# Patient Record
Sex: Female | Born: 1985 | Race: White | Hispanic: No | Marital: Single | Smoking: Never smoker
Health system: Southern US, Community
[De-identification: ages and names within clinical notes are randomized; demographics above are authoritative.]

## PROBLEM LIST (undated history)

## (undated) DIAGNOSIS — G43909 Migraine, unspecified, not intractable, without status migrainosus: Secondary | ICD-10-CM

## (undated) HISTORY — PX: NO PAST SURGERIES: SHX2092

## (undated) HISTORY — DX: Migraine, unspecified, not intractable, without status migrainosus: G43.909

---
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
## 2008-12-29 ENCOUNTER — Emergency Department (HOSPITAL_COMMUNITY): Admission: EM | Admit: 2008-12-29 | Discharge: 2008-12-29 | Payer: Self-pay | Admitting: Emergency Medicine

## 2009-11-30 IMAGING — CR DG ANKLE COMPLETE 3+V*L*
3 series · 3 of 3 positions shown · non-contrast
Comparison: None

CLINICAL DATA: The patient fell.  Lateral ankle pain.

LEFT ANKLE COMPLETE - 3+ VIEW

[t ankle joint ap left]
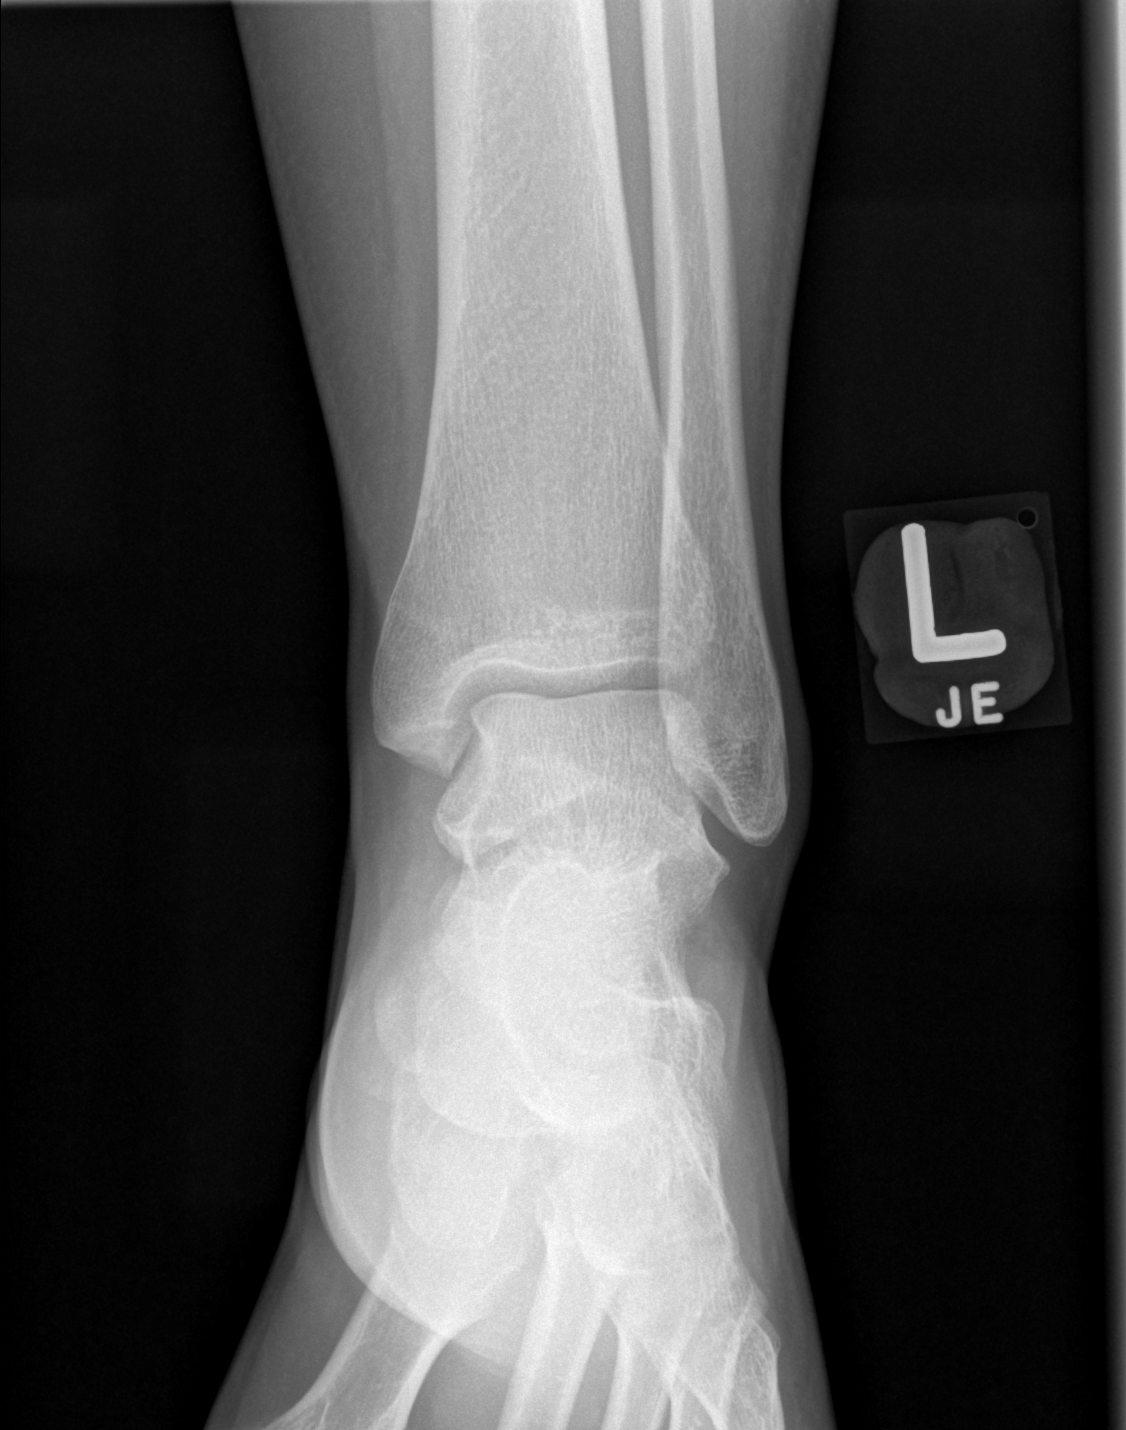

[t ankle joint oblique left]
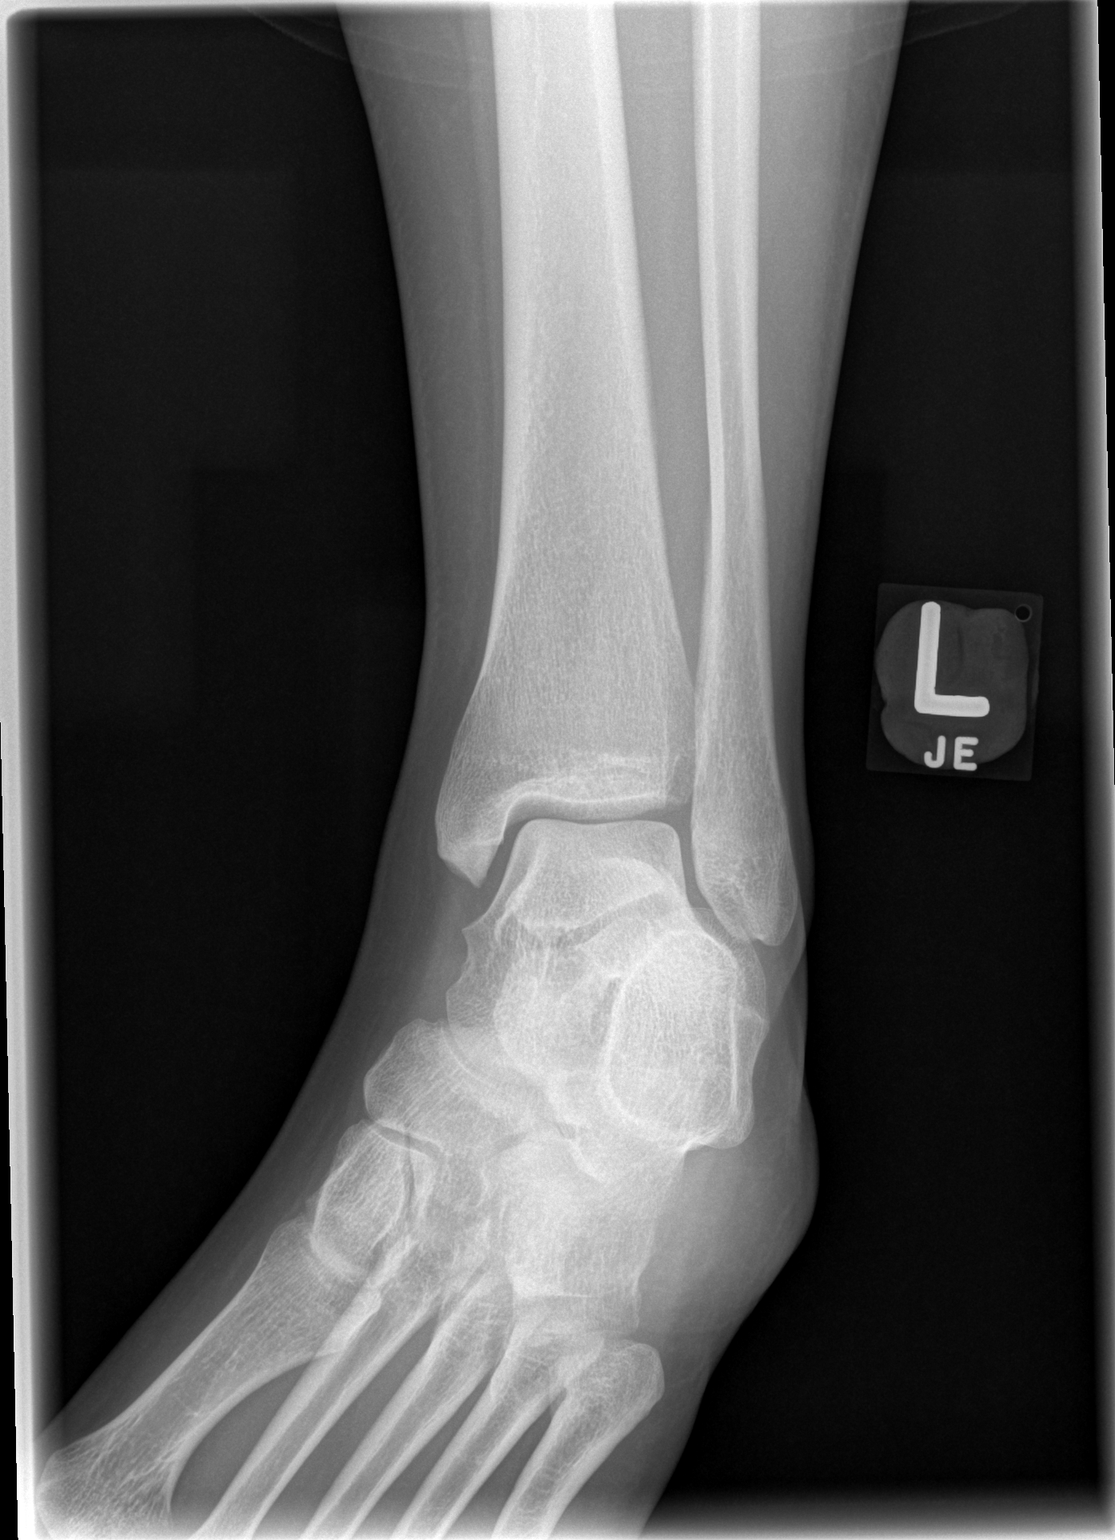

[t ankle joint lat left]
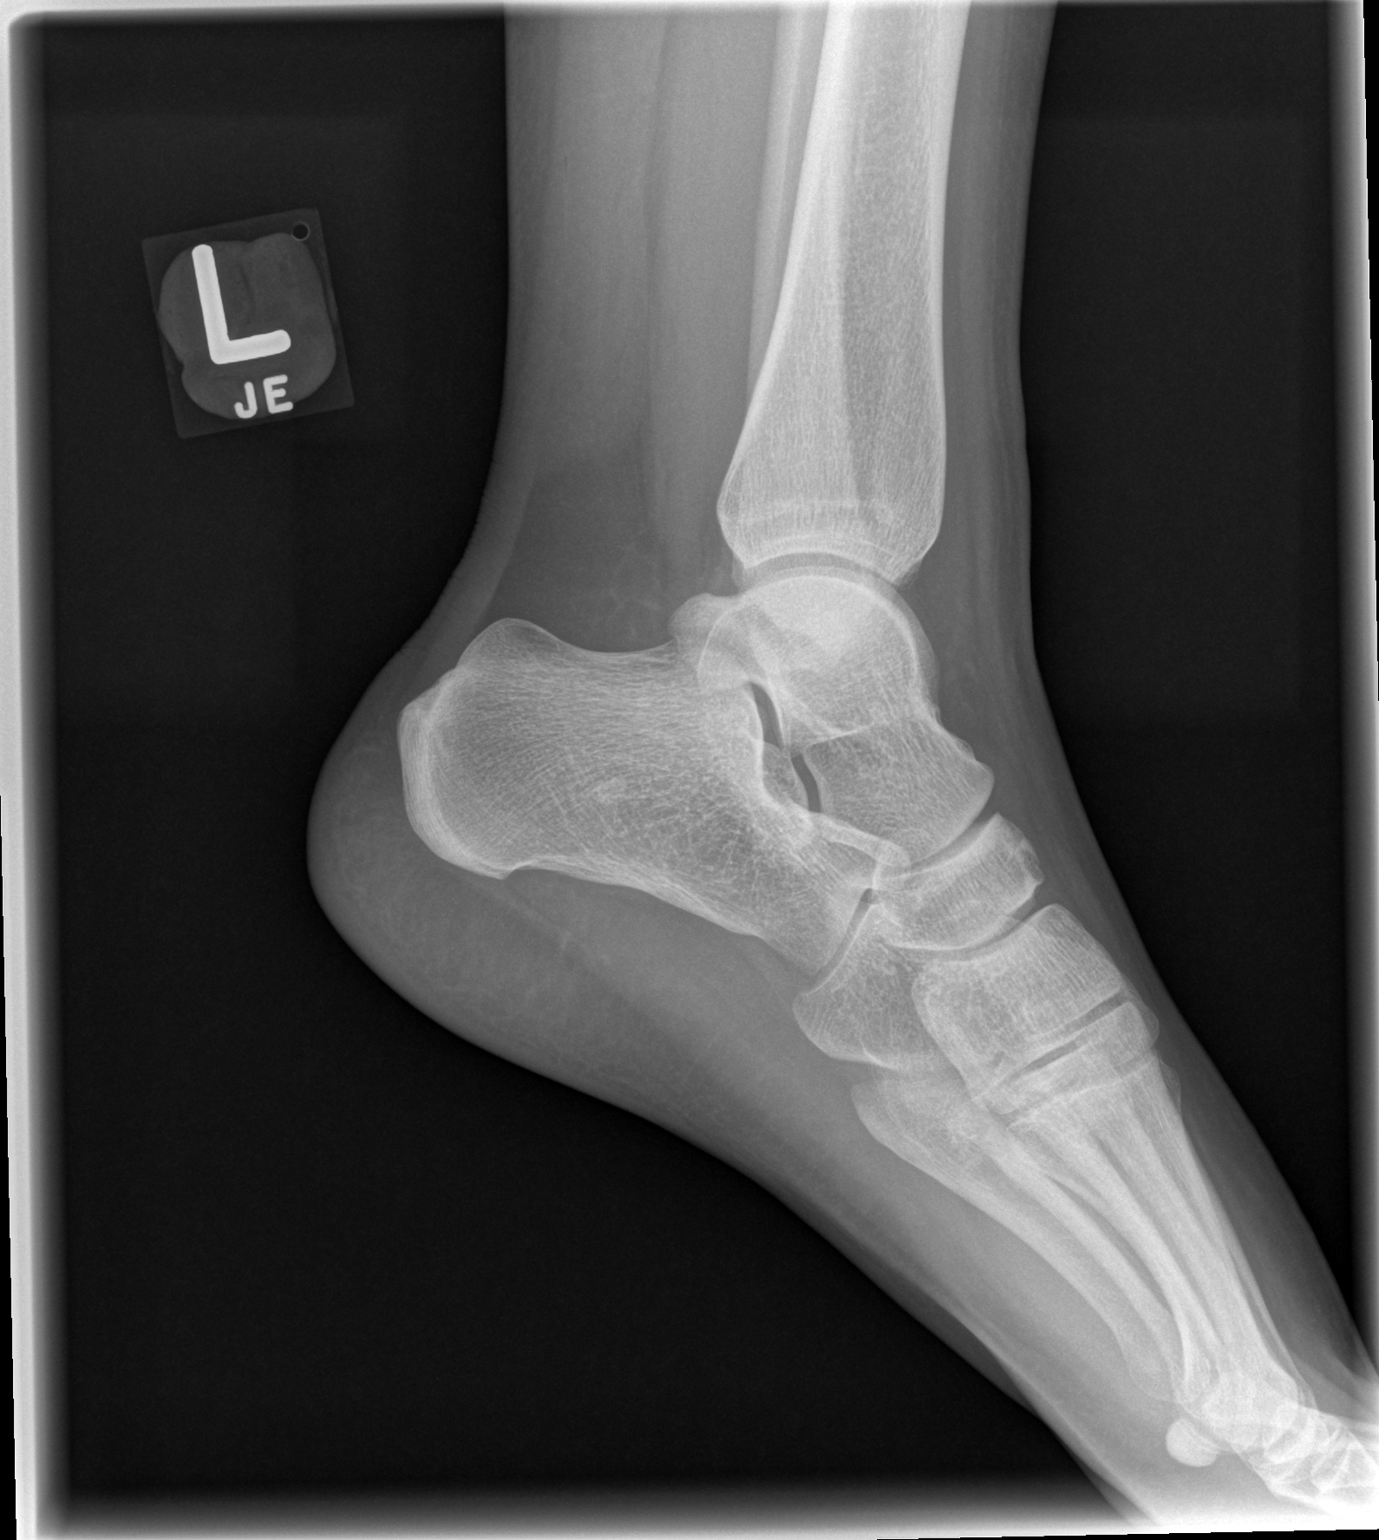

[3 of 3 positions shown; findings below may reference images not displayed]

FINDINGS: No fracture or subluxation.
IMPRESSION: No acute bony abnormality.

## 2013-09-12 ENCOUNTER — Emergency Department (HOSPITAL_COMMUNITY)
Admission: EM | Admit: 2013-09-12 | Discharge: 2013-09-12 | Disposition: A | Discharge status: Home / Self Care | Payer: Self-pay | Attending: Emergency Medicine | Admitting: Emergency Medicine

## 2013-09-12 ENCOUNTER — Encounter (HOSPITAL_COMMUNITY): Payer: Self-pay | Admitting: Emergency Medicine

## 2013-09-12 DIAGNOSIS — Y939 Activity, unspecified: Secondary | ICD-10-CM | POA: Insufficient documentation

## 2013-09-12 DIAGNOSIS — W1809XA Striking against other object with subsequent fall, initial encounter: Secondary | ICD-10-CM | POA: Insufficient documentation

## 2013-09-12 DIAGNOSIS — Y92009 Unspecified place in unspecified non-institutional (private) residence as the place of occurrence of the external cause: Secondary | ICD-10-CM | POA: Insufficient documentation

## 2013-09-12 DIAGNOSIS — S0990XA Unspecified injury of head, initial encounter: Secondary | ICD-10-CM | POA: Insufficient documentation

## 2013-09-12 DIAGNOSIS — Z3202 Encounter for pregnancy test, result negative: Secondary | ICD-10-CM | POA: Insufficient documentation

## 2013-09-12 LAB — POCT PREGNANCY, URINE: Preg Test, Ur: NEGATIVE

## 2013-09-12 MED ORDER — DEXAMETHASONE SODIUM PHOSPHATE 10 MG/ML IJ SOLN
10.0000 mg | Freq: Once | INTRAMUSCULAR | Status: AC
  Administered 2013-09-12: 10 mg via INTRAVENOUS
  Filled 2013-09-12: qty 1

## 2013-09-12 MED ORDER — KETOROLAC TROMETHAMINE 30 MG/ML IJ SOLN: 30.0000 mg | Freq: Once | INTRAMUSCULAR | Status: DC

## 2013-09-12 MED ORDER — PROCHLORPERAZINE EDISYLATE 5 MG/ML IJ SOLN
10.0000 mg | Freq: Once | INTRAMUSCULAR | Status: AC
  Administered 2013-09-12: 10 mg via INTRAVENOUS
  Filled 2013-09-12: qty 2

## 2013-09-12 MED ORDER — KETOROLAC TROMETHAMINE 60 MG/2ML IM SOLN
60.0000 mg | Freq: Once | INTRAMUSCULAR | Status: AC
  Administered 2013-09-12: 60 mg via INTRAMUSCULAR
  Filled 2013-09-12: qty 2

## 2013-09-12 MED ORDER — SODIUM CHLORIDE 0.9 % IV BOLUS (SEPSIS)
1000.0000 mL | Freq: Once | INTRAVENOUS | Status: AC
  Administered 2013-09-12: 1000 mL via INTRAVENOUS

## 2013-09-12 NOTE — ED Notes (Addendum)
Pt reports hitting left side of head on a cabinet when she bent over a week ago. Having memory problems, headache and blurred vision since. Family member states that pt "acts drunk." no acute distress and answering questions appropriately at triage.

## 2013-09-12 NOTE — ED Provider Notes (Signed)
CSN: [id number]     Arrival date & time 09/12/13  1341 History   First MD Initiated Contact with Patient 09/12/13 1542     Chief Complaint  Patient presents with  . Head Injury   (Consider location/radiation/quality/duration/timing/severity/associated sxs/prior Treatment) HPI Onset was suddenly several days ago. Pt had a fall at home where she hit the top left of her forehead on a cabinet. No LOC. Since that time pt has had worsening headache that has been of gradual onset. Similar to prior headaches but worse.  The pain is rated as severe 8/10 located to front of head. Modifying factors: worse with loud noise and light.  Associated symptoms: no fever, no neck stiffness, no bleeding.  Recent medical care: excedrin at home without relief.   History reviewed. No pertinent past medical history. History reviewed. No pertinent past surgical history. History reviewed. No pertinent family history. History  Substance Use Topics  . Smoking status: Never Smoker   . Smokeless tobacco: Not on file  . Alcohol Use: No   OB History   Grav Para Term Preterm Abortions TAB SAB Ect Mult Living                 Review of Systems Constitutional: Negative for fever.  Eyes: Negative for vision loss.  ENT: Negative for difficulty swallowing.  Cardiovascular: Negative for chest pain. Respiratory: Negative for respiratory distress.  Gastrointestinal:  Negative for vomiting.  Genitourinary: Negative for inability to void.  Musculoskeletal: Negative for gait problem.  Integumentary: Negative for rash.  Neurological: Negative for new focal weakness.     Allergies  Benadryl  Home Medications   Current Outpatient Rx  Name  Route  Sig  Dispense  Refill  . acetaminophen (TYLENOL) 500 MG tablet   Oral   Take 1,000 mg by mouth every 6 (six) hours as needed for pain.          BP 111/69  Pulse 81  Temp(Src) 98.4 F (36.9 C) (Oral)  Resp 16  Wt 233 lb 4.8 oz (105.824 kg)  SpO2 99% Physical  Exam Nursing note and vitals reviewed.  Constitutional: Pt is alert and appears stated age. Eyes: No injection, no scleral icterus. HENT: Atraumatic, airway open without erythema or exudate.  Respiratory: No respiratory distress. Equal breathing bilaterally. Cardiovascular: Normal rate. Extremities warm and well perfused.  Abdomen: Soft, non-tender. MSK: Extremities are atraumatic without deformity. Skin: No rash, no wounds.   Neuro: No motor nor sensory deficit. GCS 15. CN intact. Coordination normal. Gait normal.      ED Course  Procedures (including critical care time) Labs Review Labs Reviewed  POCT PREGNANCY, URINE   Imaging Review No results found.  EKG Interpretation   None       MDM   1. Headache    27 y.o. female presents w/ headache. Pt reports history of minor head trauma a week ago. Not a significant mechanism. No LOC. No indication for emergent imaging. Headache not c/w SAH, meningitis, bleed. More likely benign cause as pt has had similar headaches she describes as migraine in past but this is worse. May have an element of post concussive syndrome. Pt looks well. Normal neuro exam as above. Pt given IV compazine, IVF with significant improvement. U preg neg. Given IV toradol prior to d/c home with plan for close follow up. Pt doing well on re-eval. Counseling provided regarding diagnosis, treatment plan, follow up recommendations, and return precautions. Questions answered.       I  independently viewed, interpreted, and used in my medical decision making all ordered lab and imaging tests. Medical Decision Making discussed with ED attending Raeford Razor, MD      Charm Barges, MD 09/12/13 [phone]

## 2013-09-20 NOTE — ED Provider Notes (Signed)
I saw and evaluated the patient, reviewed the resident's note and I agree with the findings and plan.  EKG Interpretation   None       27yF with HA. Minor head injury several days ago. Nonfocal neuro exam. Possible some element of post-concussive syndrome. Precautions discussed.   Raeford Razor, MD 09/20/13 [phone]

## 2015-09-18 ENCOUNTER — Ambulatory Visit (INDEPENDENT_AMBULATORY_CARE_PROVIDER_SITE_OTHER): Payer: Managed Care, Other (non HMO) | Admitting: Neurology

## 2015-09-18 ENCOUNTER — Encounter: Payer: Self-pay | Admitting: Neurology

## 2015-09-18 VITALS — BP 116/70 | HR 68 | Ht 66.0 in | Wt 233.0 lb

## 2015-09-18 DIAGNOSIS — IMO0002 Reserved for concepts with insufficient information to code with codable children: Secondary | ICD-10-CM

## 2015-09-18 DIAGNOSIS — G43709 Chronic migraine without aura, not intractable, without status migrainosus: Secondary | ICD-10-CM | POA: Diagnosis not present

## 2015-09-18 DIAGNOSIS — H53489 Generalized contraction of visual field, unspecified eye: Secondary | ICD-10-CM

## 2015-09-18 MED ORDER — TOPIRAMATE 100 MG PO TABS: ORAL_TABLET | ORAL | Status: DC

## 2015-09-18 MED ORDER — RIZATRIPTAN BENZOATE 5 MG PO TBDP: 5.0000 mg | ORAL_TABLET | ORAL | Status: DC | PRN

## 2015-09-18 MED ORDER — RIZATRIPTAN BENZOATE 5 MG PO TABS: 5.0000 mg | ORAL_TABLET | ORAL | Status: DC | PRN

## 2015-09-18 NOTE — Progress Notes (Signed)
PATIENT: Valerie Knapp DOB: 1986/10/12  Chief Complaint  Patient presents with  . Migraine    She is here with her mother, Valerie Knapp. Reports having at least one migraine per week.  She has never been on any medication management.  She tends to have visual disturbances, dizziness, nausea, and noise sensitivity with her headaches.  She has tried OTC NSAIDS without relief.     HISTORICAL   Valerie Knapp is a 29 years old right-handed female accompanied by her mother, seen in refer by ophthalmologist Dr.Mark Nile RiggsShapiro for evaluation of frequent headaches   I have reviewed and summarized her most recent ophthalmology evaluation, there was no evidence of papillary edema, bilateral myopia  She reported a history of headaches since childhood, her typical migraine are bilateral frontal temporal retro-orbital area severe pounding headache with associated light noise sensitivity, lasting 1-5 days, she is having headache 2-3 times each week, has been taking frequent Excedrin migraines," like a candy", movement make her headache worse, lying in dark quiet room helps her headaches  She has never tried preventive medications in the past, she denies significant weight change, but over last 1 year, she noticed gradual onset tunnel vision, increased prescription, also see colorful spots in front of her eyes with sudden positional change.  REVIEW OF SYSTEMS: Full 14 system review of systems performed and notable only for fatigue, spinning sensation, blurry vision, double vision, loss of vision, eye pain, joint pain, achy muscles, memory loss, headaches, slurred speech, dizziness, disinterested in activities, racing thoughts  ALLERGIES: Allergies  Allergen Reactions  . Benadryl [Diphenhydramine Hcl (Sleep)] Swelling    HOME MEDICATIONS: Current Outpatient Prescriptions  Medication Sig Dispense Refill  . Aspirin-Acetaminophen-Caffeine (EXCEDRIN MIGRAINE PO) Take by mouth as needed.     No current  facility-administered medications for this visit.    PAST MEDICAL HISTORY: Past Medical History  Diagnosis Date  . Migraine     PAST SURGICAL HISTORY: Past Surgical History  Procedure Laterality Date  . No past surgeries      FAMILY HISTORY: Family History  Problem Relation Age of Onset  . Healthy Mother     SOCIAL HISTORY:  Social History   Social History  . Marital Status: Single    Spouse Name: N/A  . Number of Children: 0  . Years of Education: 14   Occupational History  . Customer Service     [employer]   Social History Main Topics  . Smoking status: Never Smoker   . Smokeless tobacco: Not on file  . Alcohol Use: No  . Drug Use: No  . Sexual Activity: Yes    Birth Control/ Protection: None   Other Topics Concern  . Not on file   Social History Narrative   Lives at home with her mother.   Right-handed.   2-6 sodas per day.     PHYSICAL EXAM   Filed Vitals:   09/18/15 0724  BP: 116/70  Pulse: 68  Height: 5\' 6"  (1.676 m)  Weight: 233 lb (105.688 kg)    Not recorded      Body mass index is 37.63 kg/(m^2).  PHYSICAL EXAMNIATION:  Gen: NAD, conversant, well nourised, obese, well groomed                     Cardiovascular: Regular rate rhythm, no peripheral edema, warm, nontender. Eyes: Conjunctivae clear without exudates or hemorrhage Neck: Supple, no carotid bruise. Pulmonary: Clear to auscultation bilaterally   NEUROLOGICAL EXAM:  MENTAL  STATUS: Speech:    Speech is normal; fluent and spontaneous with normal comprehension.  Cognition:     Orientation to time, place and person     Normal recent and remote memory     Normal Attention span and concentration     Normal Language, naming, repeating,spontaneous speech     Fund of knowledge   CRANIAL NERVES: CN II: Visual fields are full to confrontation. Fundoscopic exam is normal with sharp discs and no vascular changes. Pupils are round equal and briskly reactive to light. CN  III, IV, VI: extraocular movement are normal. No ptosis. CN V: Facial sensation is intact to pinprick in all 3 divisions bilaterally. Corneal responses are intact.  CN VII: Face is symmetric with normal eye closure and smile. CN VIII: Hearing is normal to rubbing fingers CN IX, X: Palate elevates symmetrically. Phonation is normal. CN XI: Head turning and shoulder shrug are intact CN XII: Tongue is midline with normal movements and no atrophy.  MOTOR: There is no pronator drift of out-stretched arms. Muscle bulk and tone are normal. Muscle strength is normal.  REFLEXES: Reflexes are 2+ and symmetric at the biceps, triceps, knees, and ankles. Plantar responses are flexor.  SENSORY: Intact to light touch, pinprick, position sense, and vibration sense are intact in fingers and toes.  COORDINATION: Rapid alternating movements and fine finger movements are intact. There is no dysmetria on finger-to-nose and heel-knee-shin.    GAIT/STANCE: Posture is normal. Gait is steady with normal steps, base, arm swing, and turning. Heel and toe walking are normal. Tandem gait is normal.  Romberg is absent.   DIAGNOSTIC DATA (LABS, IMAGING, TESTING) - I reviewed patient records, labs, notes, testing and imaging myself where available.   ASSESSMENT AND PLAN  Valerie Knapp is a 29 y.o. female   Chronic migraine  There is no evidence of papillary edema on examination today, but she complains of tunnel vision, increased frequency of headaches over the past 6 months,  Proceed with MRI of the brain  May consider lumbar puncture if failed Topamax treatment  Topamax 100 mg twice a day as preventive medications  Maxalt as needed  She also has a component of medicine rebound headache with frequent Excedrin Migraine intake    Levert Feinstein, M.D. Ph.D.  [facility] [address] [place], [place] [address] Ph: [phone] Fax: [phone]  CC: Jethro Bolus, MD

## 2015-09-27 ENCOUNTER — Telehealth: Payer: Self-pay | Admitting: Neurology

## 2015-09-27 NOTE — Telephone Encounter (Signed)
Pt called and states rizatriptan (MAXALT) 5 MG tablet is not working for her. Would like to know if she can try something else. Please call and advise [phone]

## 2015-09-27 NOTE — Telephone Encounter (Signed)
Spoke to De GraffKendra - she has only tried Maxalt twice - one tablet per day over a period of multiple days.  Instructed her to try it again today and repeat the dose in two hours to see if it will work better for her.  She also just started topiramate 100mg  on 09/18/15 - she is still only at her initial loading dose of 0.5 tabs,  BID. She will call back if her headache continues after trying Maxalt again, as instructed.

## 2015-09-28 MED ORDER — SUMATRIPTAN SUCCINATE 100 MG PO TABS: 100.0000 mg | ORAL_TABLET | Freq: Once | ORAL | Status: AC | PRN

## 2015-09-28 NOTE — Telephone Encounter (Signed)
Please let Patient know, I have called in Imitrex 100 mg as needed in replace of Maxalt

## 2015-09-28 NOTE — Telephone Encounter (Signed)
Patient is calling back because the medication rizatriptan (MAXALT) 5 MG tablet is not helping with headaches. Please call and discuss. Thank you.

## 2015-09-28 NOTE — Telephone Encounter (Signed)
I have spoken with Valerie Knapp this afternoon.  She sts. she has now tried Maxalt 4 times--sts. once it helped minimally, and the other 3 times it has not helped at all. Sts. she is taking Topamax as rx'd but is not happy about it "because it has taken away my only vice in life which is my soda."  I have advised she would need to continue Topamax for several weeks to realize full benefit of it.  She would like to know if there is another rescue med she can take/fim

## 2015-09-29 NOTE — Telephone Encounter (Signed)
I have spoken with Enrique SackKendra and per YY, advised Imitrex has been called to her pharmacy, to replace Maxalt.  She verbalized understanding of same/fim

## 2015-10-11 ENCOUNTER — Ambulatory Visit (INDEPENDENT_AMBULATORY_CARE_PROVIDER_SITE_OTHER): Payer: Managed Care, Other (non HMO)

## 2015-10-11 DIAGNOSIS — IMO0002 Reserved for concepts with insufficient information to code with codable children: Secondary | ICD-10-CM

## 2015-10-11 DIAGNOSIS — H53489 Generalized contraction of visual field, unspecified eye: Secondary | ICD-10-CM

## 2015-10-11 DIAGNOSIS — G43709 Chronic migraine without aura, not intractable, without status migrainosus: Secondary | ICD-10-CM | POA: Diagnosis not present

## 2015-10-16 ENCOUNTER — Telehealth: Payer: Self-pay | Admitting: *Deleted

## 2015-10-16 NOTE — Telephone Encounter (Signed)
-----   Message from Levert FeinsteinYijun Yan, MD sent at 10/16/2015  8:57 AM EST ----- Please call pt for normal MRI brain.

## 2015-10-16 NOTE — Telephone Encounter (Signed)
I called and spoke to pt and relayed that her MRI was normal.  She verbalized understanding.

## 2015-10-19 ENCOUNTER — Ambulatory Visit (INDEPENDENT_AMBULATORY_CARE_PROVIDER_SITE_OTHER): Payer: Managed Care, Other (non HMO) | Admitting: Neurology

## 2015-10-19 ENCOUNTER — Encounter: Payer: Self-pay | Admitting: Neurology

## 2015-10-19 VITALS — BP 131/80 | HR 83 | Ht 66.0 in | Wt 232.0 lb

## 2015-10-19 DIAGNOSIS — G43709 Chronic migraine without aura, not intractable, without status migrainosus: Secondary | ICD-10-CM | POA: Diagnosis not present

## 2015-10-19 DIAGNOSIS — IMO0002 Reserved for concepts with insufficient information to code with codable children: Secondary | ICD-10-CM | POA: Insufficient documentation

## 2015-10-19 MED ORDER — NORTRIPTYLINE HCL 10 MG PO CAPS: ORAL_CAPSULE | ORAL | Status: AC

## 2015-10-19 NOTE — Progress Notes (Signed)
Chief Complaint  Patient presents with  . Migraine    She is here with her mother, Misty Stanley.  She was unable to tolerate Topamax due to adverse side effects (memory loss, confusion, numbness in feet and hands).  She is getting around 2 migraines per week.  Sumatriptan has been a helpful rescue medication.      PATIENT: Valerie Knapp DOB: [birth year]  Chief Complaint  Patient presents with  . Migraine    She is here with her mother, Misty Stanley.  She was unable to tolerate Topamax due to adverse side effects (memory loss, confusion, numbness in feet and hands).  She is getting around 2 migraines per week.  Sumatriptan has been a helpful rescue medication.     HISTORICAL   Valerie Knapp is a 29 years old right-handed female accompanied by her mother, seen in refer by ophthalmologist Dr.Mark Nile Riggs for evaluation of frequent headaches   I have reviewed and summarized her most recent ophthalmology evaluation, there was no evidence of papillary edema, bilateral myopia  She reported a history of headaches since childhood, her typical migraine are bilateral frontal temporal retro-orbital area severe pounding headache with associated light noise sensitivity, lasting 1-5 days, she is having headache 2-3 times each week, has been taking frequent Excedrin migraines," like a candy", movement make her headache worse, lying in dark quiet room helps her headaches  She has never tried preventive medications in the past, she denies significant weight change, but over last 1 year, she noticed gradual onset tunnel vision, increased prescription, also see colorful spots in front of her eyes with sudden positional change.  UPDATE Oct 19 2015: She has tried Topamax 50 mg twice a day could not tolerate it, forgetful, difficulty concentrating, paresthesia, has stopped taking Topamax, she continue have headaches 2-3 times each week, Maxalt does not work for her headaches, Imitrex works for her most of the time, but  take couple hours to get away the headaches, sometimes she has to take second dose.  I have personally reviewed MRI of the brain without contrast that was normal   REVIEW OF SYSTEMS: Full 14 system review of systems performed and notable only for light sensitivity, double vision loss of vision, blurry vision hearing loss ringing ears, memory loss dizziness headaches weakness, neck pain stiffness back pain  ALLERGIES: Allergies  Allergen Reactions  . Benadryl [Diphenhydramine Hcl (Sleep)] Swelling    HOME MEDICATIONS: Current Outpatient Prescriptions  Medication Sig Dispense Refill  . Aspirin-Acetaminophen-Caffeine (EXCEDRIN MIGRAINE PO) Take by mouth as needed.    . SUMAtriptan (IMITREX) 100 MG tablet Take 1 tablet (100 mg total) by mouth once as needed for migraine. May repeat in 2 hours if headache persists or recurs. 10 tablet 2   No current facility-administered medications for this visit.    PAST MEDICAL HISTORY: Past Medical History  Diagnosis Date  . Migraine     PAST SURGICAL HISTORY: Past Surgical History  Procedure Laterality Date  . No past surgeries      FAMILY HISTORY: Family History  Problem Relation Age of Onset  . Healthy Mother     SOCIAL HISTORY:  Social History   Social History  . Marital Status: Single    Spouse Name: N/A  . Number of Children: 0  . Years of Education: 14   Occupational History  . Customer Service     [employer]   Social History Main Topics  . Smoking status: Never Smoker   . Smokeless tobacco: Not on file  .  Alcohol Use: No  . Drug Use: No  . Sexual Activity: Yes    Birth Control/ Protection: None   Other Topics Concern  . Not on file   Social History Narrative   Lives at home with her mother.   Right-handed.   2-6 sodas per day.     PHYSICAL EXAM   Filed Vitals:   10/19/15 0733  BP: 131/80  Pulse: 83  Height:  (1.676 m)  Weight: 232 lb (105.235 kg)    Not recorded      Body mass index is  37.46 kg/(m^2).  PHYSICAL EXAMNIATION:  Gen: NAD, conversant, well nourised, obese, well groomed                     Cardiovascular: Regular rate rhythm, no peripheral edema, warm, nontender. Eyes: Conjunctivae clear without exudates or hemorrhage Neck: Supple, no carotid bruise. Pulmonary: Clear to auscultation bilaterally   NEUROLOGICAL EXAM:  MENTAL STATUS: Speech:    Speech is normal; fluent and spontaneous with normal comprehension.  Cognition:     Orientation to time, place and person     Normal recent and remote memory     Normal Attention span and concentration     Normal Language, naming, repeating,spontaneous speech     Fund of knowledge   CRANIAL NERVES: CN II: Visual fields are full to confrontation. Fundoscopic exam is normal with sharp discs and no vascular changes. Pupils are round equal and briskly reactive to light. CN III, IV, VI: extraocular movement are normal. No ptosis. CN V: Facial sensation is intact to pinprick in all 3 divisions bilaterally. Corneal responses are intact.  CN VII: Face is symmetric with normal eye closure and smile. CN VIII: Hearing is normal to rubbing fingers CN IX, X: Palate elevates symmetrically. Phonation is normal. CN XI: Head turning and shoulder shrug are intact CN XII: Tongue is midline with normal movements and no atrophy.  MOTOR: There is no pronator drift of out-stretched arms. Muscle bulk and tone are normal. Muscle strength is normal.  REFLEXES: Reflexes are 2+ and symmetric at the biceps, triceps, knees, and ankles. Plantar responses are flexor.  SENSORY: Intact to light touch, pinprick, position sense, and vibration sense are intact in fingers and toes.  COORDINATION: Rapid alternating movements and fine finger movements are intact. There is no dysmetria on finger-to-nose and heel-knee-shin.    GAIT/STANCE: Posture is normal. Gait is steady with normal steps, base, arm swing, and turning. Heel and toe walking are  normal. Tandem gait is normal.  Romberg is absent.   DIAGNOSTIC DATA (LABS, IMAGING, TESTING) - I reviewed patient records, labs, notes, testing and imaging myself where available.   ASSESSMENT AND PLAN  Valerie Knapp is a 29 y.o. female    Chronic migraine  She could not tolerate Topamax, complains of forgetful, paresthesia, even with 50 mg twice a day  Will try nortriptyline 10 mg titrating to 20 mg every night as headache prevention  Maxalt does not work for headache,  Imitrex works for her most of the time, I also suggested she may combine Imitrex with NSAIDs, or Excedrin Migraine   Tunnel vision  There is no evidence of papillary edema on examination today, but she complains of tunnel vision, increased frequency of headaches over the past 6 months,  She has normal MRI of the brain  I will refer her back to ophthalmologist for further evaluation      Levert Feinstein, M.D. Ph.D.  [facility] [address], [address] [place], [place] [address] Ph: [phone] Fax: [phone]  CC: Jethro BolusMark Shapiro, MD

## 2015-10-20 ENCOUNTER — Inpatient Hospital Stay (HOSPITAL_COMMUNITY): Payer: Managed Care, Other (non HMO) | Admitting: Certified Registered"

## 2015-10-20 ENCOUNTER — Encounter (HOSPITAL_COMMUNITY): Payer: Self-pay | Admitting: Certified Registered"

## 2015-10-20 ENCOUNTER — Encounter (HOSPITAL_COMMUNITY)
Admission: AD | Disposition: A | Discharge status: Home / Self Care | Payer: Self-pay | Source: Ambulatory Visit | Attending: Ophthalmology

## 2015-10-20 ENCOUNTER — Ambulatory Visit (HOSPITAL_COMMUNITY)
Admission: AD | Admit: 2015-10-20 | Discharge: 2015-10-20 | Disposition: A | Discharge status: Home / Self Care | Payer: Managed Care, Other (non HMO) | Source: Ambulatory Visit | Attending: Ophthalmology | Admitting: Ophthalmology

## 2015-10-20 ENCOUNTER — Ambulatory Visit: Payer: Self-pay | Admitting: Ophthalmology

## 2015-10-20 DIAGNOSIS — H3321 Serous retinal detachment, right eye: Secondary | ICD-10-CM | POA: Diagnosis present

## 2015-10-20 HISTORY — PX: SCLERAL BUCKLE WITH CRYO: SHX5341

## 2015-10-20 SURGERY — SCLERAL BUCKLE WITH CRYO
Anesthesia: General | Site: Eye | Laterality: Right

## 2015-10-20 MED ORDER — HYALURONIDASE HUMAN 150 UNIT/ML IJ SOLN
INTRAMUSCULAR | Status: AC
  Filled 2015-10-20: qty 1

## 2015-10-20 MED ORDER — STERILE WATER FOR INJECTION IJ SOLN
INTRAMUSCULAR | Status: DC | PRN
  Administered 2015-10-20: 20 mL

## 2015-10-20 MED ORDER — CEFAZOLIN SUBCONJUNCTIVAL INJECTION 100 MG/0.5 ML
100.0000 mg | INJECTION | SUBCONJUNCTIVAL | Status: DC
  Filled 2015-10-20 (×3): qty 5

## 2015-10-20 MED ORDER — HYPROMELLOSE (GONIOSCOPIC) 2.5 % OP SOLN
OPHTHALMIC | Status: AC
  Filled 2015-10-20: qty 15

## 2015-10-20 MED ORDER — BSS IO SOLN
INTRAOCULAR | Status: AC
  Filled 2015-10-20: qty 15

## 2015-10-20 MED ORDER — METOCLOPRAMIDE HCL 5 MG/ML IJ SOLN
INTRAMUSCULAR | Status: DC | PRN
  Administered 2015-10-20: 10 mg via INTRAVENOUS

## 2015-10-20 MED ORDER — DEXAMETHASONE SODIUM PHOSPHATE 10 MG/ML IJ SOLN
INTRAMUSCULAR | Status: AC
  Filled 2015-10-20: qty 1

## 2015-10-20 MED ORDER — OFLOXACIN 0.3 % OP SOLN
1.0000 [drp] | Freq: Four times a day (QID) | OPHTHALMIC | Status: AC
  Administered 2015-10-20 (×3): 1 [drp] via OPHTHALMIC
  Filled 2015-10-20 (×2): qty 5

## 2015-10-20 MED ORDER — ROCURONIUM BROMIDE 50 MG/5ML IV SOLN
INTRAVENOUS | Status: AC
Start: 2015-10-20 — End: 2015-10-20
  Filled 2015-10-20: qty 1

## 2015-10-20 MED ORDER — METOCLOPRAMIDE HCL 5 MG/ML IJ SOLN
INTRAMUSCULAR | Status: AC
  Filled 2015-10-20: qty 2

## 2015-10-20 MED ORDER — NEOSTIGMINE METHYLSULFATE 10 MG/10ML IV SOLN
INTRAVENOUS | Status: DC | PRN
  Administered 2015-10-20: 3 mg via INTRAVENOUS

## 2015-10-20 MED ORDER — GLYCOPYRROLATE 0.2 MG/ML IJ SOLN
INTRAMUSCULAR | Status: DC | PRN
  Administered 2015-10-20: .5 mg via INTRAVENOUS

## 2015-10-20 MED ORDER — CEFTAZIDIME 1 G IJ SOLR
INTRAMUSCULAR | Status: AC
  Filled 2015-10-20: qty 1

## 2015-10-20 MED ORDER — PROPARACAINE HCL 0.5 % OP SOLN
1.0000 [drp] | OPHTHALMIC | Status: AC
  Administered 2015-10-20 (×3): 1 [drp] via OPHTHALMIC
  Filled 2015-10-20 (×2): qty 15

## 2015-10-20 MED ORDER — TETRACAINE HCL 0.5 % OP SOLN
OPHTHALMIC | Status: AC
  Filled 2015-10-20: qty 2

## 2015-10-20 MED ORDER — LIDOCAINE HCL 2 % IJ SOLN
INTRAMUSCULAR | Status: AC
  Filled 2015-10-20: qty 20

## 2015-10-20 MED ORDER — TOBRAMYCIN-DEXAMETHASONE 0.3-0.1 % OP OINT
TOPICAL_OINTMENT | OPHTHALMIC | Status: AC
  Filled 2015-10-20: qty 3.5

## 2015-10-20 MED ORDER — MIDAZOLAM HCL 5 MG/5ML IJ SOLN
INTRAMUSCULAR | Status: DC | PRN
  Administered 2015-10-20: 2 mg via INTRAVENOUS

## 2015-10-20 MED ORDER — DEXAMETHASONE SODIUM PHOSPHATE 4 MG/ML IJ SOLN
INTRAMUSCULAR | Status: AC
  Filled 2015-10-20: qty 1

## 2015-10-20 MED ORDER — SODIUM CHLORIDE 0.9 % IJ SOLN
INTRAMUSCULAR | Status: AC
  Filled 2015-10-20: qty 10

## 2015-10-20 MED ORDER — FENTANYL CITRATE (PF) 100 MCG/2ML IJ SOLN: 25.0000 ug | INTRAMUSCULAR | Status: DC | PRN

## 2015-10-20 MED ORDER — MIDAZOLAM HCL 2 MG/2ML IJ SOLN
INTRAMUSCULAR | Status: AC
  Filled 2015-10-20: qty 2

## 2015-10-20 MED ORDER — PROMETHAZINE HCL 25 MG/ML IJ SOLN: 6.2500 mg | INTRAMUSCULAR | Status: DC | PRN

## 2015-10-20 MED ORDER — GLYCOPYRROLATE 0.2 MG/ML IJ SOLN
INTRAMUSCULAR | Status: AC
  Filled 2015-10-20: qty 3

## 2015-10-20 MED ORDER — HYPROMELLOSE (GONIOSCOPIC) 2.5 % OP SOLN
OPHTHALMIC | Status: DC | PRN
  Administered 2015-10-20: 2 [drp] via OPHTHALMIC

## 2015-10-20 MED ORDER — DEXAMETHASONE SODIUM PHOSPHATE 4 MG/ML IJ SOLN
INTRAMUSCULAR | Status: DC | PRN
  Administered 2015-10-20: 4 mg via INTRAVENOUS

## 2015-10-20 MED ORDER — STERILE WATER FOR INJECTION IJ SOLN
INTRAMUSCULAR | Status: AC
  Filled 2015-10-20: qty 20

## 2015-10-20 MED ORDER — TOBRAMYCIN-DEXAMETHASONE 0.3-0.1 % OP OINT
TOPICAL_OINTMENT | OPHTHALMIC | Status: DC | PRN
  Administered 2015-10-20: 1 via OPHTHALMIC

## 2015-10-20 MED ORDER — LIDOCAINE HCL (CARDIAC) 20 MG/ML IV SOLN
INTRAVENOUS | Status: DC | PRN
  Administered 2015-10-20: 100 mg via INTRAVENOUS

## 2015-10-20 MED ORDER — CEFAZOLIN SODIUM 1 G IJ SOLR
INTRAMUSCULAR | Status: DC | PRN
  Administered 2015-10-20: 100 mg via INTRAMUSCULAR

## 2015-10-20 MED ORDER — ROCURONIUM BROMIDE 100 MG/10ML IV SOLN
INTRAVENOUS | Status: DC | PRN
  Administered 2015-10-20: 10 mg via INTRAVENOUS
  Administered 2015-10-20: 40 mg via INTRAVENOUS

## 2015-10-20 MED ORDER — POLYMYXIN B SULFATE 500000 UNITS IJ SOLR
INTRAMUSCULAR | Status: AC
  Filled 2015-10-20: qty 1

## 2015-10-20 MED ORDER — FENTANYL CITRATE (PF) 250 MCG/5ML IJ SOLN
INTRAMUSCULAR | Status: AC
  Filled 2015-10-20: qty 5

## 2015-10-20 MED ORDER — NEOSTIGMINE METHYLSULFATE 10 MG/10ML IV SOLN
INTRAVENOUS | Status: AC
  Filled 2015-10-20: qty 1

## 2015-10-20 MED ORDER — LACTATED RINGERS IV SOLN
INTRAVENOUS | Status: DC
  Administered 2015-10-20 (×2): via INTRAVENOUS

## 2015-10-20 MED ORDER — SUCCINYLCHOLINE CHLORIDE 20 MG/ML IJ SOLN
INTRAMUSCULAR | Status: DC | PRN
  Administered 2015-10-20: 100 mg via INTRAVENOUS

## 2015-10-20 MED ORDER — ONDANSETRON HCL 4 MG/2ML IJ SOLN
INTRAMUSCULAR | Status: DC | PRN
  Administered 2015-10-20: 4 mg via INTRAVENOUS

## 2015-10-20 MED ORDER — LIDOCAINE HCL 2 % IJ SOLN
INTRAMUSCULAR | Status: DC | PRN
  Administered 2015-10-20: 5 mL

## 2015-10-20 MED ORDER — CEFAZOLIN SODIUM-DEXTROSE 2-3 GM-% IV SOLR
INTRAVENOUS | Status: AC
  Filled 2015-10-20: qty 50

## 2015-10-20 MED ORDER — TROPICAMIDE 1 % OP SOLN
1.0000 [drp] | OPHTHALMIC | Status: AC
  Administered 2015-10-20 (×3): 1 [drp] via OPHTHALMIC
  Filled 2015-10-20: qty 3

## 2015-10-20 MED ORDER — TRAMADOL HCL 50 MG PO TABS: 50.0000 mg | ORAL_TABLET | ORAL | Status: DC | PRN

## 2015-10-20 MED ORDER — BSS IO SOLN
INTRAOCULAR | Status: DC | PRN
  Administered 2015-10-20 (×3): 15 mL via INTRAOCULAR

## 2015-10-20 MED ORDER — FENTANYL CITRATE (PF) 100 MCG/2ML IJ SOLN
INTRAMUSCULAR | Status: DC | PRN
  Administered 2015-10-20: 50 ug via INTRAVENOUS
  Administered 2015-10-20: 25 ug via INTRAVENOUS
  Administered 2015-10-20: 50 ug via INTRAVENOUS
  Administered 2015-10-20: 25 ug via INTRAVENOUS

## 2015-10-20 MED ORDER — DEXAMETHASONE SODIUM PHOSPHATE 10 MG/ML IJ SOLN
INTRAMUSCULAR | Status: DC | PRN
  Administered 2015-10-20: 10 mg

## 2015-10-20 MED ORDER — ONDANSETRON HCL 4 MG/2ML IJ SOLN
INTRAMUSCULAR | Status: AC
  Filled 2015-10-20: qty 2

## 2015-10-20 MED ORDER — ATROPINE SULFATE 1 % OP SOLN
OPHTHALMIC | Status: AC
  Filled 2015-10-20: qty 5

## 2015-10-20 MED ORDER — PROPOFOL 10 MG/ML IV BOLUS
INTRAVENOUS | Status: DC | PRN
  Administered 2015-10-20: 200 mg via INTRAVENOUS

## 2015-10-20 SURGICAL SUPPLY — 47 items
APPLICATOR COTTON TIP 6IN STRL (MISCELLANEOUS) ×4 IMPLANT
APPLICATOR DR MATTHEWS STRL (MISCELLANEOUS) ×4 IMPLANT
BAND SCLERAL BUCKLING TYPE 42 (Ophthalmic Related) ×4 IMPLANT
BLADE MINI 60D BLUE (BLADE) ×4 IMPLANT
BLADE MINI RND TIP GREEN BEAV (BLADE) IMPLANT
CANNULA ANT CHAM MAIN (OPHTHALMIC RELATED) IMPLANT
CAUTERY EYE LOW TEMP 1300F FIN (OPHTHALMIC RELATED) ×4 IMPLANT
CORDS BIPOLAR (ELECTRODE) ×4 IMPLANT
COVER SURGICAL LIGHT HANDLE (MISCELLANEOUS) ×4 IMPLANT
FILTER BLUE MILLIPORE (MISCELLANEOUS) IMPLANT
FILTER STRAW FLUID ASPIR (MISCELLANEOUS) IMPLANT
GAS OPHTHALMIC (MISCELLANEOUS) IMPLANT
GLOVE BIO SURGEON STRL SZ7.5 (GLOVE) ×4 IMPLANT
GLOVE SURG SS PI 6.5 STRL IVOR (GLOVE) ×12 IMPLANT
GOWN STRL REUS W/ TWL LRG LVL3 (GOWN DISPOSABLE) ×4 IMPLANT
GOWN STRL REUS W/TWL LRG LVL3 (GOWN DISPOSABLE) ×4
KIT BASIN OR (CUSTOM PROCEDURE TRAY) ×4 IMPLANT
KIT PERFLUORON PROCEDURE 5ML (MISCELLANEOUS) IMPLANT
KIT ROOM TURNOVER OR (KITS) ×4 IMPLANT
NEEDLE 18GX1X1/2 (RX/OR ONLY) (NEEDLE) ×8 IMPLANT
NEEDLE HYPO 25GX1X1/2 BEV (NEEDLE) ×4 IMPLANT
NEEDLE HYPO 30X.5 LL (NEEDLE) ×8 IMPLANT
NS IRRIG 1000ML POUR BTL (IV SOLUTION) ×4 IMPLANT
PACK VITRECTOMY CUSTOM (CUSTOM PROCEDURE TRAY) ×4 IMPLANT
PAD ARMBOARD 7.5X6 YLW CONV (MISCELLANEOUS) ×8 IMPLANT
PAK VITRECTOMY PIK 25 GA (OPHTHALMIC RELATED) IMPLANT
PROBE LASER ILLUM FLEX CVD 23G (OPHTHALMIC) IMPLANT
REPL STRA BRUSH NEEDLE (NEEDLE) IMPLANT
RESERVOIR BACK FLUSH (MISCELLANEOUS) IMPLANT
RETRACTOR IRIS FLEX 25G GRIESH (INSTRUMENTS) IMPLANT
ROLLS DENTAL (MISCELLANEOUS) IMPLANT
SET FLUID INJECTOR (SET/KITS/TRAYS/PACK) IMPLANT
SHEET MEDIUM DRAPE 40X70 STRL (DRAPES) ×4 IMPLANT
STOCKINETTE IMPERVIOUS 9X36 MD (GAUZE/BANDAGES/DRESSINGS) ×8 IMPLANT
STOPCOCK 4 WAY LG BORE MALE ST (IV SETS) IMPLANT
SUT ETHILON 5.0 S-24 (SUTURE) ×8 IMPLANT
SUT SILK 2 0 (SUTURE) ×2
SUT SILK 2-0 18XBRD TIE 12 (SUTURE) ×2 IMPLANT
SUT VICRYL 7 0 TG140 8 (SUTURE) ×4 IMPLANT
SUT VICRYL 8 0 TG140 8 (SUTURE) ×4 IMPLANT
SYR 20CC LL (SYRINGE) ×4 IMPLANT
SYRINGE 10CC LL (SYRINGE) ×8 IMPLANT
TAPE SURG TRANSPORE 1 IN (GAUZE/BANDAGES/DRESSINGS) ×2 IMPLANT
TAPE SURGICAL TRANSPORE 1 IN (GAUZE/BANDAGES/DRESSINGS) ×2
TOWEL OR 17X24 6PK STRL BLUE (TOWEL DISPOSABLE) ×8 IMPLANT
WATER STERILE IRR 1000ML POUR (IV SOLUTION) ×4 IMPLANT
WIPE INSTRUMENT VISIWIPE 73X73 (MISCELLANEOUS) ×4 IMPLANT

## 2015-10-20 NOTE — Discharge Instructions (Signed)
Sleep with head elevated

## 2015-10-20 NOTE — Progress Notes (Signed)
Pt offered pregnancy test but refused, reporting "there is no way I am pregnant". Dr. Mable FillB. Judd aware.

## 2015-10-20 NOTE — H&P (Signed)
  Subjective:     Valerie Knapp is a 29 y.o. female who presents with retinal detachment in her right eye. 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    Objective:    There were no vitals taken for this visit.  General:  alert, cooperative and appears stated age  Skin:  normal  Eyes: Slit lamp exam unremarkable, right inferior macula-on retinal detachment, left inferior macula-off retinal detachment  Mouth: MMM no lesions  Lymph Nodes:  Cervical, supraclavicular, and axillary nodes normal.  Lungs:  clear to auscultation bilaterally  Heart:  regular rate and rhythm, S1, S2 normal, no murmur, click, rub or gallop  Abdomen: soft, non-tender; bowel sounds normal; no masses,  no organomegaly  CVA:  absent  Genitourinary: defer exam  Extremities:  extremities normal, atraumatic, no cyanosis or edema  Neurologic:    Psychiatric:  normal mood, behavior, speech, dress, and thought processes     Assessment:     Retinal detachment right eye - macula-on.      Plan:    1. Discussed the risk of surgery including vision loss and the need for additional surgery,  and the risks of general anesthetic including MI, CVA, sudden death or even reaction to anesthetic medications. The patient understands the risks, any and all questions were answered to the patient's satisfaction. 2. Retinal detachment repair right eye with scleral buckle, external drain and cryo   Date of Surgery Update (To be completed by Attending Surgeon day of surgery.)  There have been no significant clinical changes since the completion of the above H&P.

## 2015-10-20 NOTE — Brief Op Note (Signed)
10/20/2015  9:18 PM  PATIENT:  Valerie Knapp  29 y.o. female  PRE-OPERATIVE DIAGNOSIS:  Retinal Detachment Right Eye  POST-OPERATIVE DIAGNOSIS:  Retinal Detachment Right eye  PROCEDURE:  Procedure(s): SCLERAL BUCKLE RIGHT EYE  With external drain and cryo(Right)  SURGEON:  Surgeon(s) and Role:    * Carmela RimaNarendra Jacayla Nordell, MD - Primary  PHYSICIAN ASSISTANT:   ASSISTANTS: none   ANESTHESIA:   general  EBL:  Total I/O In: 500 [I.V.:500] Out: -   BLOOD ADMINISTERED:none  DRAINS: none   LOCAL MEDICATIONS USED:  LIDOCAINE   SPECIMEN:  No Specimen  DISPOSITION OF SPECIMEN:  N/A  COUNTS:  YES  TOURNIQUET:  * No tourniquets in log *  DICTATION: .Note written in EPIC  PLAN OF CARE: Discharge to home after PACU  PATIENT DISPOSITION:  PACU - hemodynamically stable.   Delay start of Pharmacological VTE agent (>24hrs) due to surgical blood loss or risk of bleeding: not applicable

## 2015-10-20 NOTE — Transfer of Care (Signed)
Immediate Anesthesia Transfer of Care Note  Patient: Valerie Knapp  Procedure(s) Performed: Procedure(s): SCLERAL BUCKLE WITH CRYO, EXTERNAL DRAIN (Right)  Patient Location: PACU  Anesthesia Type:General  Level of Consciousness: awake, alert , oriented and patient cooperative  Airway & Oxygen Therapy: Patient Spontanous Breathing and Patient connected to nasal cannula oxygen  Post-op Assessment: Report given to RN and Post -op Vital signs reviewed and stable  Post vital signs: Reviewed and stable  Last Vitals:  Filed Vitals:   10/20/15 1829 10/20/15 2127  BP: 133/58   Pulse: 68 89  Temp: 36.9 C 36.3 C  Resp: 18 29    Complications: No apparent anesthesia complications

## 2015-10-20 NOTE — Op Note (Signed)
PATIENT:  Valerie BooksKendra Gawlik  29 y.o. female  PRE-OPERATIVE DIAGNOSIS:  Retinal Detachment Right Eye  POST-OPERATIVE DIAGNOSIS:  Retinal Detachment Right eye  PROCEDURE:  Procedure(s): SCLERAL BUCKLE WITH CRYO, EXTERNAL DRAIN (Right)  Findings: Inferior retinal detachment, lattice degeneration with holes.  Complications: None  After informed consent was obtained the patient was brought to the operating room and general anesthesia was attained.  The right eye was prepped and draped for surgery.  Wescott scissors were used to make a 360 degree peritomy.  Tenon's was dissected back in each of the four quadrants with Stevens scissors.  Each rectus muscle was isolated with silk suture and tenons bluntly dissected back with cotton swabs.  Each quadrant was inspected and noted to be free of ectasia, masses, or prominent vessels.  Cryo was applied over the inferior lattice.  The breaks within the lattice degeneration were identified for proper placement of the scleral buckle.  5-0 nylon mattress sutures were placed in each quadrant approximately 2 mm posterior to the muscle insertion and spaced 4.5 mm apart.  The #42 band was placed under the rectus muscles and mattress sutures and secured in place with a Watzke sleeve in the superotemporal quadrant.  External drain was performed with a 30 gauge needle at the 7 o'clock position (the highest part of the inferior retinal detachment.  The buckle was tightened to attain adequate imbrication and support of the lattice degeneration and each of the mattress sutures were tied in place.  The inferior retina was noted to lay flat against the buckle.  Tenon's was reapproximated to the muscle insertion and conjunctiva closed to the limbus.  Subconjunctival injection of Ancef and Decadron were placed along with retrobulbar 2% lidocaine for post-operative analgesia.  The optic disc was noted to have good perfusion and the intraocular pressure was noted to be normal by digital  palpation.  The eye was cleaned and dressed with a sterile patch and shield after maxitrol was placed in the sulcus.  The patient tolerated the procedure well.

## 2015-10-20 NOTE — Anesthesia Procedure Notes (Signed)
Procedure Name: Intubation Date/Time: 10/20/2015 6:58 PM Performed by: Trixie Deis A Pre-anesthesia Checklist: Patient identified, Timeout performed, Emergency Drugs available, Suction available and Patient being monitored Patient Re-evaluated:Patient Re-evaluated prior to inductionOxygen Delivery Method: Circle system utilized Preoxygenation: Pre-oxygenation with 100% oxygen Intubation Type: IV induction Ventilation: Mask ventilation without difficulty Laryngoscope Size: Mac and 3 Grade View: Grade I Tube type: Oral Tube size: 7.0 mm Number of attempts: 1 Airway Equipment and Method: Stylet and LTA kit utilized Placement Confirmation: ETT inserted through vocal cords under direct vision,  breath sounds checked- equal and bilateral and positive ETCO2 Secured at: 21 cm Tube secured with: Tape Dental Injury: Teeth and Oropharynx as per pre-operative assessment

## 2015-10-20 NOTE — Anesthesia Postprocedure Evaluation (Signed)
Anesthesia Post Note  Patient: Randel BooksKendra Schiff  Procedure(s) Performed: Procedure(s) (LRB): SCLERAL BUCKLE WITH CRYO, EXTERNAL DRAIN (Right)  Patient location during evaluation: PACU Anesthesia Type: General Level of consciousness: awake and alert Pain management: pain level controlled Vital Signs Assessment: post-procedure vital signs reviewed and stable Respiratory status: spontaneous breathing, nonlabored ventilation, respiratory function stable and patient connected to nasal cannula oxygen Cardiovascular status: blood pressure returned to baseline and stable Postop Assessment: no signs of nausea or vomiting Anesthetic complications: no    Last Vitals:  Filed Vitals:   10/20/15 1829 10/20/15 2127  BP: 133/58 115/61  Pulse: 68 89  Temp: 36.9 C 36.3 C  Resp: 18 19    Last Pain: There were no vitals filed for this visit.               Reino KentJudd, Takoda Janowiak J

## 2015-10-20 NOTE — Anesthesia Preprocedure Evaluation (Addendum)
Anesthesia Evaluation  Patient identified by MRN, date of birth, ID band Patient awake    Reviewed: Allergy & Precautions, NPO status , Patient's Chart, lab work & pertinent test results  Airway Mallampati: I  TM Distance: >3 FB Neck ROM: Full    Dental no notable dental hx. (+) Dental Advisory Given   Pulmonary neg pulmonary ROS,    Pulmonary exam normal breath sounds clear to auscultation       Cardiovascular negative cardio ROS Normal cardiovascular exam Rhythm:Regular Rate:Normal     Neuro/Psych  Headaches, negative psych ROS   GI/Hepatic negative GI ROS, Neg liver ROS,   Endo/Other  Morbid obesity  Renal/GU negative Renal ROS  negative genitourinary   Musculoskeletal negative musculoskeletal ROS (+)   Abdominal   Peds negative pediatric ROS (+)  Hematology negative hematology ROS (+)   Anesthesia Other Findings   Reproductive/Obstetrics negative OB ROS                            Anesthesia Physical Anesthesia Plan  ASA: II  Anesthesia Plan: General   Post-op Pain Management:    Induction: Intravenous  Airway Management Planned: Oral ETT  Additional Equipment:   Intra-op Plan:   Post-operative Plan: Extubation in OR  Informed Consent: I have reviewed the patients History and Physical, chart, labs and discussed the procedure including the risks, benefits and alternatives for the proposed anesthesia with the patient or authorized representative who has indicated his/her understanding and acceptance.   Dental advisory given  Plan Discussed with: CRNA and Surgeon  Anesthesia Plan Comments: (NPO appropriate at 6pm)       Anesthesia Quick Evaluation

## 2015-10-23 ENCOUNTER — Encounter (HOSPITAL_COMMUNITY): Payer: Self-pay | Admitting: Ophthalmology

## 2016-01-18 ENCOUNTER — Ambulatory Visit: Payer: Managed Care, Other (non HMO) | Admitting: Adult Health

## 2023-11-13 ENCOUNTER — Emergency Department (HOSPITAL_COMMUNITY): Payer: Managed Care, Other (non HMO)

## 2023-11-13 ENCOUNTER — Other Ambulatory Visit: Payer: Self-pay

## 2023-11-13 ENCOUNTER — Inpatient Hospital Stay (HOSPITAL_COMMUNITY)
Admission: EM | Admit: 2023-11-13 | Discharge: 2023-11-18 | DRG: 552 | Disposition: A | Discharge status: Home / Self Care | Payer: Managed Care, Other (non HMO) | Attending: Internal Medicine | Admitting: Internal Medicine

## 2023-11-13 DIAGNOSIS — M545 Low back pain, unspecified: Principal | ICD-10-CM | POA: Diagnosis present

## 2023-11-13 DIAGNOSIS — Z6841 Body Mass Index (BMI) 40.0 and over, adult: Secondary | ICD-10-CM

## 2023-11-13 DIAGNOSIS — G43909 Migraine, unspecified, not intractable, without status migrainosus: Secondary | ICD-10-CM | POA: Diagnosis present

## 2023-11-13 DIAGNOSIS — M5127 Other intervertebral disc displacement, lumbosacral region: Secondary | ICD-10-CM | POA: Diagnosis not present

## 2023-11-13 DIAGNOSIS — Z1152 Encounter for screening for COVID-19: Secondary | ICD-10-CM

## 2023-11-13 DIAGNOSIS — M5126 Other intervertebral disc displacement, lumbar region: Secondary | ICD-10-CM | POA: Diagnosis present

## 2023-11-13 DIAGNOSIS — Z79899 Other long term (current) drug therapy: Secondary | ICD-10-CM

## 2023-11-13 DIAGNOSIS — E876 Hypokalemia: Secondary | ICD-10-CM | POA: Diagnosis present

## 2023-11-13 DIAGNOSIS — R739 Hyperglycemia, unspecified: Secondary | ICD-10-CM | POA: Diagnosis present

## 2023-11-13 DIAGNOSIS — Z888 Allergy status to other drugs, medicaments and biological substances status: Secondary | ICD-10-CM

## 2023-11-13 DIAGNOSIS — D72829 Elevated white blood cell count, unspecified: Secondary | ICD-10-CM | POA: Diagnosis present

## 2023-11-13 LAB — URINALYSIS, ROUTINE W REFLEX MICROSCOPIC
Bilirubin Urine: NEGATIVE
Glucose, UA: NEGATIVE mg/dL
Ketones, ur: NEGATIVE mg/dL
Leukocytes,Ua: NEGATIVE
Nitrite: NEGATIVE
Protein, ur: 30 mg/dL — AB
Specific Gravity, Urine: 1.015 (ref 1.005–1.030)
pH: 6 (ref 5.0–8.0)

## 2023-11-13 LAB — BASIC METABOLIC PANEL
Anion gap: 10 (ref 5–15)
BUN: 12 mg/dL (ref 6–20)
CO2: 20 mmol/L — ABNORMAL LOW (ref 22–32)
Calcium: 9.2 mg/dL (ref 8.9–10.3)
Chloride: 107 mmol/L (ref 98–111)
Creatinine, Ser: 0.72 mg/dL (ref 0.44–1.00)
GFR, Estimated: >60 mL/min (ref >60)
Glucose, Bld: 122 mg/dL — ABNORMAL HIGH (ref 70–99)
Potassium: 3.1 mmol/L — ABNORMAL LOW (ref 3.5–5.1)
Sodium: 137 mmol/L (ref 135–145)

## 2023-11-13 LAB — PREGNANCY, URINE: Preg Test, Ur: NEGATIVE

## 2023-11-13 LAB — CBC
HCT: 40 % (ref 36.0–46.0)
Hemoglobin: 13.4 g/dL (ref 12.0–15.0)
MCH: 29.5 pg (ref 26.0–34.0)
MCHC: 33.5 g/dL (ref 30.0–36.0)
MCV: 88.1 fL (ref 80.0–100.0)
Platelets: 482 10*3/uL — ABNORMAL HIGH (ref 150–400)
RBC: 4.54 MIL/uL (ref 3.87–5.11)
RDW: 13.6 % (ref 11.5–15.5)
WBC: 22.5 10*3/uL — ABNORMAL HIGH (ref 4.0–10.5)
nRBC: 0 % (ref 0.0–0.2)

## 2023-11-13 MED ORDER — SODIUM CHLORIDE 0.9 % IV BOLUS
1000.0000 mL | Freq: Once | INTRAVENOUS | Status: AC
  Administered 2023-11-13: 1000 mL via INTRAVENOUS

## 2023-11-13 MED ORDER — KETOROLAC TROMETHAMINE 30 MG/ML IJ SOLN
30.0000 mg | Freq: Once | INTRAMUSCULAR | Status: AC
  Administered 2023-11-13: 30 mg via INTRAVENOUS
  Filled 2023-11-13: qty 1

## 2023-11-13 MED ORDER — METHOCARBAMOL 500 MG PO TABS
500.0000 mg | ORAL_TABLET | Freq: Once | ORAL | Status: AC
  Administered 2023-11-13: 500 mg via ORAL
  Filled 2023-11-13: qty 1

## 2023-11-13 MED ORDER — HYDROCODONE-ACETAMINOPHEN 5-325 MG PO TABS
1.0000 | ORAL_TABLET | Freq: Once | ORAL | Status: AC
  Administered 2023-11-13: 1 via ORAL
  Filled 2023-11-13: qty 1

## 2023-11-13 MED ORDER — KETOROLAC TROMETHAMINE 15 MG/ML IJ SOLN
30.0000 mg | Freq: Once | INTRAMUSCULAR | Status: DC
  Filled 2023-11-13: qty 2

## 2023-11-13 MED ORDER — LIDOCAINE 5 % EX PTCH
1.0000 | MEDICATED_PATCH | CUTANEOUS | Status: DC
  Administered 2023-11-13: 1 via TRANSDERMAL
  Filled 2023-11-13: qty 1

## 2023-11-13 MED ORDER — ACETAMINOPHEN 325 MG PO TABS
650.0000 mg | ORAL_TABLET | Freq: Once | ORAL | Status: AC
  Administered 2023-11-13: 650 mg via ORAL
  Filled 2023-11-13: qty 2

## 2023-11-13 MED ORDER — GADOBUTROL 1 MMOL/ML IV SOLN
10.0000 mL | Freq: Once | INTRAVENOUS | Status: AC | PRN
  Administered 2023-11-13: 10 mL via INTRAVENOUS

## 2023-11-13 MED ORDER — MORPHINE SULFATE (PF) 4 MG/ML IV SOLN
4.0000 mg | Freq: Once | INTRAVENOUS | Status: AC
  Administered 2023-11-13: 4 mg via INTRAVENOUS
  Filled 2023-11-13: qty 1

## 2023-11-13 MED ORDER — ONDANSETRON HCL 4 MG/2ML IJ SOLN
4.0000 mg | Freq: Once | INTRAMUSCULAR | Status: AC
  Administered 2023-11-13: 4 mg via INTRAVENOUS
  Filled 2023-11-13: qty 2

## 2023-11-13 MED ORDER — POTASSIUM CHLORIDE CRYS ER 20 MEQ PO TBCR
40.0000 meq | EXTENDED_RELEASE_TABLET | Freq: Once | ORAL | Status: AC
  Administered 2023-11-13: 40 meq via ORAL
  Filled 2023-11-13: qty 2

## 2023-11-13 MED ORDER — LIDOCAINE 5 % EX PTCH: 1.0000 | MEDICATED_PATCH | CUTANEOUS | 0 refills | Status: AC

## 2023-11-13 MED ORDER — METHOCARBAMOL 500 MG PO TABS: 500.0000 mg | ORAL_TABLET | Freq: Two times a day (BID) | ORAL | 0 refills | Status: AC

## 2023-11-13 NOTE — ED Provider Triage Note (Addendum)
 Emergency Medicine Provider Triage Evaluation Note  Valerie Knapp , a 38 y.o. female  was evaluated in triage.  Pt complains of back pain.  Review of Systems  Positive:  Negative:   Physical Exam  There were no vitals taken for this visit. Gen:   Awake, no distress   Resp:  Normal effort  MSK:   Moves extremities without difficulty  Other:    Medical Decision Making  Medically screening exam initiated at 2:31 PM.  Appropriate orders placed.  Valerie Knapp was informed that the remainder of the evaluation will be completed by another provider, this initial triage assessment does not replace that evaluation, and the importance of remaining in the ED until their evaluation is complete.  Patient stating that they tweaked their back around 2 weeks ago and has been having troubles with BL back spasms since. Has been taking Advil without full relief. Patient stating that the back spasms became very severe today. Patient stating that the back spasms are very severe when they happen. Denies abdominal pain. Denies dysuria/hematuria.     Hoy Nidia FALCON, [place] 11/13/23 1434

## 2023-11-13 NOTE — Discharge Instructions (Addendum)
 Evaluation today was overall reassuring.  I suspect you may have pulled a muscle in your lower back which is likely causing your symptoms.  Treatment at this time is supportive.  This includes gentle stretching daily, applying ice 3-4 times a day for the next 4 days, you can also take ibuprofen.  Please follow-up with your PCP.  I also sent Lidoderm  patches to your pharmacy.  If you are interested in assistance with losing weight [facility] is an physiological scientist. They are a provider led medical weight loss practice where they partner with you to create an individualized plan to help you reach your goals.  Phone #: [phone] Address: [address] [place], [place]

## 2023-11-13 NOTE — ED Triage Notes (Signed)
 Pt arrives via EMS from her workplace. Pt reports ongoing lower back pain and spasms for the past two weeks. Pt AxOx4.

## 2023-11-13 NOTE — ED Notes (Signed)
 RN attempted to ambulate the patient. Patient was unable to sit up.

## 2023-11-13 NOTE — ED Provider Notes (Addendum)
 [facility] Provider Note   CSN: [id number] Arrival date & time: 11/13/23  1420     History  Chief Complaint  Patient presents with   Back Pain   HPI Valerie Knapp is a 38 y.o. female with history of migraines presenting for back pain. Has been going on for about a week and a half. 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  Since then her entire low back is hurting.  She denies saddle anesthesia, urinary or bowel incontinence or retention.  Denies fever.  Denies IV drug use.  States that she has been off for the last 3 days and did absolutely nothing in an attempt to give her back some rest.  He returned to work today and lifting a small box at her back began spasm and lock up.  States she took ibuprofen this morning which provided minimal relief.  Denies abdominal pain, nausea vomiting diarrhea.    Back Pain      Home Medications Prior to Admission medications   Medication Sig Start Date End Date Taking? Authorizing Provider  lidocaine  (LIDODERM ) 5 % Place 1 patch onto the skin daily. Remove & Discard patch within 12 hours or as directed by MD 11/13/23  Yes Lang Norleen POUR, PA-C  methocarbamol  (ROBAXIN ) 500 MG tablet Take 1 tablet (500 mg total) by mouth 2 (two) times daily. 11/13/23  Yes Ivaan Liddy K, PA-C  Multiple Vitamin (MULTIVITAMIN WITH MINERALS) TABS tablet Take 1 tablet by mouth daily.    [provider]  nortriptyline  (PAMELOR ) 10 MG capsule One po qhs xone week, then 2 tabs po qhs Patient taking differently: Take 10-20 mg by mouth at bedtime. Take 1 tablet (10 mg) by mouth at bedtime for one week, then take 2 tablets (20 mg) daily at bedtime 10/19/15   Onita Duos, MD  SUMAtriptan  (IMITREX ) 100 MG tablet Take 1 tablet (100 mg total) by mouth once as needed for migraine. May repeat in 2 hours if headache persists or recurs. 09/28/15   Onita Duos, MD       Allergies    Benadryl [diphenhydramine hcl (sleep)]    Review of Systems   Review of Systems  Musculoskeletal:  Positive for back pain.    Physical Exam Updated Vital Signs BP 136/86   Pulse 96   Temp 97.7 F (36.5 C) (Oral)   Resp 18   SpO2 100%  Physical Exam Constitutional:      Appearance: Normal appearance.  HENT:     Head: Normocephalic.     Nose: Nose normal.  Eyes:     Conjunctiva/sclera: Conjunctivae normal.  Pulmonary:     Effort: Pulmonary effort is normal.  Musculoskeletal:     Comments: Patient lying flat in the stretcher.  Refusing to move her back.  Was able to range her hips and lower legs and appeared to be normal.  Pedal pulses are 2+ bilaterally.  Neurological:     Mental Status: She is alert.  Psychiatric:        Mood and Affect: Mood normal.     ED Results / Procedures / Treatments   Labs (all labs ordered are listed, but only abnormal results are displayed) Labs Reviewed  URINALYSIS, ROUTINE W REFLEX MICROSCOPIC - Abnormal; Notable for the following components:      Result Value   Hgb urine dipstick LARGE (*)    Protein, ur 30 (*)  Bacteria, UA RARE (*)    All other components within normal limits  CBC - Abnormal; Notable for the following components:   WBC 22.5 (*)    Platelets 482 (*)    All other components within normal limits  BASIC METABOLIC PANEL - Abnormal; Notable for the following components:   Potassium 3.1 (*)    CO2 20 (*)    Glucose, Bld 122 (*)    All other components within normal limits  PREGNANCY, URINE    EKG None  Radiology CT L-SPINE NO CHARGE Result Date: 11/13/2023 CLINICAL DATA:  Pt reports ongoing lower back pain and spasms for the past two weeks. EXAM: CT LUMBAR SPINE WITHOUT CONTRAST TECHNIQUE: Multidetector CT imaging of the lumbar spine was performed without intravenous contrast administration. Multiplanar CT image reconstructions were also generated. RADIATION DOSE REDUCTION: This exam was performed  according to the departmental dose-optimization program which includes automated exposure control, adjustment of the mA and/or kV according to patient size and/or use of iterative reconstruction technique. COMPARISON:  None Available. FINDINGS: Segmentation: 5 lumbar type vertebrae. Alignment: Normal. Vertebrae: No acute fracture or focal pathologic process. Paraspinal and other soft tissues: Negative. Disc levels: Maintained. IMPRESSION: No acute displaced fracture or traumatic listhesis of the lumbar spine. Electronically Signed   By: Morgane  Naveau M.D.   On: 11/13/2023 21:29   CT Renal Stone Study Result Date: 11/13/2023 CLINICAL DATA:  Abdominal/flank pain, stone suspected Pt reports ongoing lower back pain and spasms for the past two weeks. EXAM: CT ABDOMEN AND PELVIS WITHOUT CONTRAST TECHNIQUE: Multidetector CT imaging of the abdomen and pelvis was performed following the standard protocol without IV contrast. RADIATION DOSE REDUCTION: This exam was performed according to the departmental dose-optimization program which includes automated exposure control, adjustment of the mA and/or kV according to patient size and/or use of iterative reconstruction technique. COMPARISON:  None Available. FINDINGS: Lower chest: No acute abnormality. Hepatobiliary: No focal liver abnormality. No gallstones, gallbladder wall thickening, or pericholecystic fluid. No biliary dilatation. Pancreas: No focal lesion. Normal pancreatic contour. No surrounding inflammatory changes. No main pancreatic ductal dilatation. Spleen: Normal in size without focal abnormality. Adrenals/Urinary Tract: No adrenal nodule bilaterally. No nephrolithiasis and no hydronephrosis. No definite contour-deforming renal mass. No ureterolithiasis or hydroureter. The urinary bladder is unremarkable. Stomach/Bowel: Stomach is within normal limits. No evidence of bowel wall thickening or dilatation. Appendix appears normal. Vascular/Lymphatic: No abdominal  aorta or iliac aneurysm. No abdominal, pelvic, or inguinal lymphadenopathy. Reproductive: Uterus and bilateral adnexa are unremarkable. Other: No intraperitoneal free fluid. No intraperitoneal free gas. No organized fluid collection. Musculoskeletal: No abdominal wall hernia or abnormality. No suspicious lytic or blastic osseous lesions. No acute displaced fracture. Please see separately dictated CT lumbar spine 11/13/2023. IMPRESSION: 1. No acute intra-abdominal or intrapelvic abnormality with limited evaluation on this noncontrast study. 2. Please see separately dictated CT lumbar spine 11/13/2023 Electronically Signed   By: Morgane  Naveau M.D.   On: 11/13/2023 21:28    Procedures Procedures    Medications Ordered in ED Medications  lidocaine  (LIDODERM ) 5 % 1 patch (1 patch Transdermal Patch Applied 11/13/23 2200)  HYDROcodone -acetaminophen  (NORCO/VICODIN) 5-325 MG per tablet 1 tablet (1 tablet Oral Given 11/13/23 1550)  acetaminophen  (TYLENOL ) tablet 650 mg (650 mg Oral Given 11/13/23 1642)  methocarbamol  (ROBAXIN ) tablet 500 mg (500 mg Oral Given 11/13/23 1748)  sodium chloride  0.9 % bolus 1,000 mL (0 mLs Intravenous Stopped 11/13/23 2327)  ketorolac  (TORADOL ) 30 MG/ML injection 30 mg (30 mg Intravenous Given 11/13/23  1754)  morphine  (PF) 4 MG/ML injection 4 mg (4 mg Intravenous Given 11/13/23 1854)  ondansetron  (ZOFRAN ) injection 4 mg (4 mg Intravenous Given 11/13/23 1854)  gadobutrol  (GADAVIST ) 1 MMOL/ML injection 10 mL (10 mLs Intravenous Contrast Given 11/13/23 2309)  potassium chloride  SA (KLOR-CON  M) CR tablet 40 mEq (40 mEq Oral Given 11/13/23 2329)    ED Course/ Medical Decision Making/ A&P Clinical Course as of 11/13/23 2333  Thu Nov 13, 2023  1944 CT Renal Stone Study [JR]  2257 CT L-SPINE NO CHARGE [JR]  2329 MRI pending, looking for cauda equina vs infection. Unable to ambulate. Pain more well controlled at this point. If MRI negative --> patient can discharge home. No distal weakness. No  IVDU.  [CG]  2331 Occurred after doing bend and twist at [employer]. Now here with trouble walking. Pain is reason for decreased ability to ambulate.  [CG]    Clinical Course User Index [CG] Ruthell Lonni FALCON, PA-C [JR] Lang Norleen POUR, PA-C                                 Medical Decision Making Amount and/or Complexity of Data Reviewed Labs: ordered. Radiology: ordered. Decision-making details documented in ED Course.  Risk OTC drugs. Prescription drug management.   38 year old well-appearing female present for back pain. Exam notable for limited mobility of the lower back due to pain but otherwise reassuring.  Patient stated that she did not ambulate from the car to the parking lot but refused to ambulate to the bathroom during the encounter due to pain.  Considered renal pathology but unlikely given no urinary symptoms and no flank tenderness. Suspect this is a muscle injury. On reassessment, patient was persistently tachycardic and endorsing considerable pain. Treated with Toradol  and Robaxin . Sent basic labs and urinalysis. Labs revealed leukocytosis (22.5K). On reassessment, I attempted to ambulate patient and patient stated that she was unable to stand on her own. Patient could not successfully attempt 1 step without assistance. Also endorsing persistent pain. Also noted that she is still persistently tachycardic.  Some improvement after volume resuscitation. Given the leukocytosis, persistent severe pain and tachycardia, thought it was appropriate to proceed further with MRI of the lumbar spine. If MRI is reassuring, patient will likely be discharged treatment of acute low back pain at home with PCP follow-up.  Otherwise if MRI is abnormal may need admission. Signed out to PA [employer]   Final Clinical Impression(s) / ED Diagnoses Final diagnoses:  Low back pain, unspecified back pain laterality, unspecified chronicity, unspecified whether sciatica present    Rx  / DC Orders ED Discharge Orders          Ordered    lidocaine  (LIDODERM ) 5 %  Every 24 hours        11/13/23 1637    methocarbamol  (ROBAXIN ) 500 MG tablet  2 times daily        11/13/23 2328               Lang Norleen POUR, PA-C 11/13/23 2333    Midge Golas, MD 11/14/23 [phone]

## 2023-11-14 ENCOUNTER — Observation Stay (HOSPITAL_COMMUNITY): Payer: Managed Care, Other (non HMO)

## 2023-11-14 ENCOUNTER — Encounter (HOSPITAL_COMMUNITY): Payer: Self-pay | Admitting: Internal Medicine

## 2023-11-14 DIAGNOSIS — Z888 Allergy status to other drugs, medicaments and biological substances status: Secondary | ICD-10-CM | POA: Diagnosis not present

## 2023-11-14 DIAGNOSIS — Z1152 Encounter for screening for COVID-19: Secondary | ICD-10-CM | POA: Diagnosis not present

## 2023-11-14 DIAGNOSIS — G43909 Migraine, unspecified, not intractable, without status migrainosus: Secondary | ICD-10-CM | POA: Diagnosis present

## 2023-11-14 DIAGNOSIS — E876 Hypokalemia: Secondary | ICD-10-CM | POA: Diagnosis present

## 2023-11-14 DIAGNOSIS — D72829 Elevated white blood cell count, unspecified: Secondary | ICD-10-CM | POA: Diagnosis present

## 2023-11-14 DIAGNOSIS — M5127 Other intervertebral disc displacement, lumbosacral region: Secondary | ICD-10-CM | POA: Diagnosis present

## 2023-11-14 DIAGNOSIS — M545 Low back pain, unspecified: Secondary | ICD-10-CM | POA: Diagnosis present

## 2023-11-14 DIAGNOSIS — M5126 Other intervertebral disc displacement, lumbar region: Secondary | ICD-10-CM | POA: Diagnosis not present

## 2023-11-14 DIAGNOSIS — Z6841 Body Mass Index (BMI) 40.0 and over, adult: Secondary | ICD-10-CM | POA: Diagnosis not present

## 2023-11-14 DIAGNOSIS — R739 Hyperglycemia, unspecified: Secondary | ICD-10-CM | POA: Diagnosis present

## 2023-11-14 DIAGNOSIS — Z79899 Other long term (current) drug therapy: Secondary | ICD-10-CM | POA: Diagnosis not present

## 2023-11-14 LAB — COMPREHENSIVE METABOLIC PANEL
ALT: 36 U/L (ref 0–44)
AST: 26 U/L (ref 15–41)
Albumin: 4.6 g/dL (ref 3.5–5.0)
Alkaline Phosphatase: 90 U/L (ref 38–126)
Anion gap: 10 (ref 5–15)
BUN: 9 mg/dL (ref 6–20)
CO2: 21 mmol/L — ABNORMAL LOW (ref 22–32)
Calcium: 9.4 mg/dL (ref 8.9–10.3)
Chloride: 105 mmol/L (ref 98–111)
Creatinine, Ser: 0.64 mg/dL (ref 0.44–1.00)
GFR, Estimated: >60 mL/min (ref >60)
Glucose, Bld: 157 mg/dL — ABNORMAL HIGH (ref 70–99)
Potassium: 3.2 mmol/L — ABNORMAL LOW (ref 3.5–5.1)
Sodium: 136 mmol/L (ref 135–145)
Total Bilirubin: 0.6 mg/dL (ref 0.0–1.2)
Total Protein: 9.5 g/dL — ABNORMAL HIGH (ref 6.5–8.1)

## 2023-11-14 LAB — CBC WITH DIFFERENTIAL/PLATELET
Abs Immature Granulocytes: 0.13 10*3/uL — ABNORMAL HIGH (ref 0.00–0.07)
Basophils Absolute: 0 10*3/uL (ref 0.0–0.1)
Basophils Relative: 0 %
Eosinophils Absolute: 0 10*3/uL (ref 0.0–0.5)
Eosinophils Relative: 0 %
HCT: 42.6 % (ref 36.0–46.0)
Hemoglobin: 13.6 g/dL (ref 12.0–15.0)
Immature Granulocytes: 1 %
Lymphocytes Relative: 5 %
Lymphs Abs: 0.9 10*3/uL (ref 0.7–4.0)
MCH: 28.3 pg (ref 26.0–34.0)
MCHC: 31.9 g/dL (ref 30.0–36.0)
MCV: 88.8 fL (ref 80.0–100.0)
Monocytes Absolute: 0.1 10*3/uL (ref 0.1–1.0)
Monocytes Relative: 1 %
Neutro Abs: 16.2 10*3/uL — ABNORMAL HIGH (ref 1.7–7.7)
Neutrophils Relative %: 93 %
Platelets: 503 10*3/uL — ABNORMAL HIGH (ref 150–400)
RBC: 4.8 MIL/uL (ref 3.87–5.11)
RDW: 14 % (ref 11.5–15.5)
WBC: 17.4 10*3/uL — ABNORMAL HIGH (ref 4.0–10.5)
nRBC: 0 % (ref 0.0–0.2)

## 2023-11-14 LAB — RESPIRATORY PANEL BY PCR

## 2023-11-14 LAB — MAGNESIUM: Magnesium: 2.3 mg/dL (ref 1.7–2.4)

## 2023-11-14 LAB — SARS CORONAVIRUS 2 BY RT PCR: SARS Coronavirus 2 by RT PCR: NEGATIVE

## 2023-11-14 MED ORDER — ONDANSETRON HCL 4 MG/2ML IJ SOLN
4.0000 mg | Freq: Four times a day (QID) | INTRAMUSCULAR | Status: DC | PRN
  Administered 2023-11-14: 4 mg via INTRAVENOUS
  Filled 2023-11-14: qty 2

## 2023-11-14 MED ORDER — FENTANYL CITRATE PF 50 MCG/ML IJ SOSY
100.0000 ug | PREFILLED_SYRINGE | Freq: Once | INTRAMUSCULAR | Status: AC
  Administered 2023-11-14: 100 ug via INTRAVENOUS
  Filled 2023-11-14: qty 2

## 2023-11-14 MED ORDER — TIZANIDINE HCL 4 MG PO TABS
4.0000 mg | ORAL_TABLET | Freq: Once | ORAL | Status: AC
  Administered 2023-11-14: 4 mg via ORAL
  Filled 2023-11-14: qty 1

## 2023-11-14 MED ORDER — OXYCODONE HCL 5 MG PO TABS
5.0000 mg | ORAL_TABLET | ORAL | Status: DC | PRN
  Administered 2023-11-14 – 2023-11-18 (×16): 5 mg via ORAL
  Filled 2023-11-14 (×16): qty 1

## 2023-11-14 MED ORDER — NALOXONE HCL 0.4 MG/ML IJ SOLN: 0.4000 mg | INTRAMUSCULAR | Status: DC | PRN

## 2023-11-14 MED ORDER — KETOROLAC TROMETHAMINE 15 MG/ML IJ SOLN
15.0000 mg | Freq: Four times a day (QID) | INTRAMUSCULAR | Status: AC
  Administered 2023-11-14 – 2023-11-15 (×6): 15 mg via INTRAVENOUS
  Filled 2023-11-14 (×6): qty 1

## 2023-11-14 MED ORDER — LORATADINE 10 MG PO TABS
10.0000 mg | ORAL_TABLET | Freq: Every day | ORAL | Status: DC
  Administered 2023-11-14 – 2023-11-18 (×5): 10 mg via ORAL
  Filled 2023-11-14 (×5): qty 1

## 2023-11-14 MED ORDER — HYDROMORPHONE HCL 1 MG/ML IJ SOLN
1.0000 mg | INTRAMUSCULAR | Status: DC | PRN
  Administered 2023-11-14 (×2): 1 mg via INTRAVENOUS
  Filled 2023-11-14 (×2): qty 1

## 2023-11-14 MED ORDER — LIDOCAINE 5 % EX PTCH
2.0000 | MEDICATED_PATCH | CUTANEOUS | Status: DC
  Administered 2023-11-15 – 2023-11-17 (×3): 2 via TRANSDERMAL
  Filled 2023-11-14 (×3): qty 2

## 2023-11-14 MED ORDER — FENTANYL CITRATE PF 50 MCG/ML IJ SOSY: 25.0000 ug | PREFILLED_SYRINGE | INTRAMUSCULAR | Status: DC | PRN

## 2023-11-14 MED ORDER — MELATONIN 3 MG PO TABS
3.0000 mg | ORAL_TABLET | Freq: Every evening | ORAL | Status: DC | PRN
  Administered 2023-11-15 – 2023-11-16 (×2): 3 mg via ORAL
  Filled 2023-11-14 (×2): qty 1

## 2023-11-14 MED ORDER — DEXAMETHASONE SODIUM PHOSPHATE 10 MG/ML IJ SOLN
10.0000 mg | Freq: Once | INTRAMUSCULAR | Status: AC
  Administered 2023-11-14: 10 mg via INTRAVENOUS
  Filled 2023-11-14: qty 1

## 2023-11-14 MED ORDER — KETOROLAC TROMETHAMINE 15 MG/ML IJ SOLN: 15.0000 mg | Freq: Four times a day (QID) | INTRAMUSCULAR | Status: DC | PRN

## 2023-11-14 MED ORDER — ACETAMINOPHEN 325 MG PO TABS
650.0000 mg | ORAL_TABLET | Freq: Four times a day (QID) | ORAL | Status: DC | PRN
  Administered 2023-11-15 (×2): 650 mg via ORAL
  Filled 2023-11-14 (×2): qty 2

## 2023-11-14 MED ORDER — POTASSIUM CHLORIDE CRYS ER 20 MEQ PO TBCR
40.0000 meq | EXTENDED_RELEASE_TABLET | ORAL | Status: AC
  Administered 2023-11-14 (×2): 40 meq via ORAL
  Filled 2023-11-14 (×2): qty 2

## 2023-11-14 MED ORDER — HYDROMORPHONE HCL 1 MG/ML IJ SOLN
1.0000 mg | Freq: Once | INTRAMUSCULAR | Status: AC
  Administered 2023-11-14: 1 mg via INTRAVENOUS
  Filled 2023-11-14: qty 1

## 2023-11-14 MED ORDER — TIZANIDINE HCL 4 MG PO TABS
4.0000 mg | ORAL_TABLET | Freq: Three times a day (TID) | ORAL | Status: DC | PRN
  Administered 2023-11-14 – 2023-11-17 (×7): 4 mg via ORAL
  Filled 2023-11-14 (×8): qty 1

## 2023-11-14 MED ORDER — POLYETHYLENE GLYCOL 3350 17 G PO PACK: 17.0000 g | PACK | Freq: Every day | ORAL | Status: DC | PRN

## 2023-11-14 MED ORDER — GABAPENTIN 100 MG PO CAPS
100.0000 mg | ORAL_CAPSULE | Freq: Three times a day (TID) | ORAL | Status: DC
  Administered 2023-11-14 – 2023-11-15 (×4): 100 mg via ORAL
  Filled 2023-11-14 (×4): qty 1

## 2023-11-14 MED ORDER — ACETAMINOPHEN 650 MG RE SUPP: 650.0000 mg | Freq: Four times a day (QID) | RECTAL | Status: DC | PRN

## 2023-11-14 NOTE — Progress Notes (Signed)
  Carryover admission to the Day Admitter.  I discussed this case with the EDP, Medford Lites, PA.  Per these discussions:   This is a 38 year old female who is being admitted with pain control relating to acute low back pain after she presents complaining of new low back pain that started as she was attempting to lift a 40 pound box with a twisting motion.    Presentation is not associate with any acute focal weakness, and EDP conveys that the patient exhibits 5 out of 5 strength in the bilateral lower extremities.  However, she is having difficulty ambulating on the basis of suboptimal pain control as relates to her acute low back pain.  Presentation is not associate with any acute focal numbness or paresthesias, nor any saddle anesthesias.  Additionally, presentation is not associate with urinary retention or fecal incontinence.  She continues to have suboptimal pain control after an extensive analgesic regimen in the ED that includes, but is not limited to doses of hydrocodone , Dilaudid , morphine , fentanyl , Toradol , lidocaine  patch.  MRI of the lumbar spine showed some degenerative disc disease with disc protrusion at L5-S1 with right S1 nerve root impingement  I have placed an order for observation to MedSurg for further evaluation management of the above.   I have placed some additional preliminary admit orders via the adult multi-morbid admission order set. I have also ordered fall precautions, prn IV Dilaudid , prn IV Toradol , as needed Narcan , prn Xanax for muscle spasms, continue deep system and for lidocaine  patch.  Also ordered fall precautions, PT/OT consults for the morning as well as morning labs include CMP, CBC, and magnesium level.    Eva Pore, DO Hospitalist

## 2023-11-14 NOTE — Progress Notes (Signed)
 Orthopedic Tech Progress Note Patient Details:  Valerie Knapp January 27, 1986 [id number] Abdominal binder applied per PT request  Ortho Devices Type of Ortho Device: Abdominal binder Ortho Device/Splint Interventions: Application   Post Interventions Patient Tolerated: Well  Massie BRAVO Sunita Demond 11/14/2023, 12:27 PM

## 2023-11-14 NOTE — Evaluation (Signed)
 Physical Therapy Evaluation Patient Details Name: Valerie Knapp MRN: [id number] DOB: 1986/04/23 Today's Date: 11/14/2023  History of Present Illness  pt in ED 38 y/o who works at [employer] and has to lift heavy boxes, reachign and twisting. She began experiencing some back pain about 3 years ago, however got more pronounced after a few weeks ago when she was bednign and twisting to reach something. Since then it has gotten progressively worse with difficult sitting and standign up from the commode without severe back pain once standing. Her pain is on the ight side of he back, into her right glut area, as well as some on the left low back area and some numbness (  ight side a lot worse than left). MRI shows L5 -S1 Disc height loss and desiccation. Large right subarticular  disc protrusion with severe right subarticular recess stenosis and  impingement of the descending right S1 nerve roots. Severe canal  stenosis.  Clinical Impression   Pt in ED in hallway on stretcher. Gathered a lot of history of current situation ( see above) . Completed a lot of education for postioning, movements and back alignment for bulging disc and pain minimizing. ( See below unde exercises). Was able to get patient to stand and walk with abdominal binder and decompression with bilateral axillary crutches. This allowed some relief to allow patient to ambulate. Educated on how to ascend steps to get into her house. Toileting is the most challenging due to the flexed posture at hips making posterior pelvic tilt and allowing for more disc space and protrusion. It is really painful for her to them rise and try to get her back realigned upright after this position. Gave her some tips on this as well. Feel patient will not be able to perform any basic upright duties for wok or even seated duties at work at this point until this resolves more.  Will continue to follow if remains here.       If plan is discharge home, recommend  the following: A little help with walking and/or transfers;A little help with bathing/dressing/bathroom;Assist for transportation;Help with stairs or ramp for entrance   Can travel by private vehicle        Equipment Recommendations Crutches  Recommendations for Other Services       Functional Status Assessment Patient has had a recent decline in their functional status and demonstrates the ability to make significant improvements in function in a reasonable and predictable amount of time.     Precautions / Restrictions Precautions Precautions: Back Required Braces or Orthoses: Spinal Brace (abdominal binder for some compression to relieve some of the posterior back pain.) Spinal Brace: Lumbar corset;Applied in standing position (applied for comfort when eve she would like it) Restrictions Weight Bearing Restrictions Per Provider Order: No      Mobility  Bed Mobility Overal bed mobility: Needs Assistance Bed Mobility: Sidelying to Sit, Sit to Sidelying   Sidelying to sit: Min assist     Sit to sidelying: Min assist General bed mobility comments: cued her with log rolling to sidelying fo both in and out of bed to prevent abdominal pressure on disc. This helped a lot MinA to just make sure she was about to perform correctly and she was on a stretcher which was difficult.    Transfers Overall transfer level: Modified independent Equipment used: Crutches               General transfer comment: just cues on how to minimize  the back pain and to help offload spine with bilateral crutches once standing. encouaged upright standing instead of forward leaning, even though forward leaning takes the pressure off her pain and disc, we are trying to get her upright againa nd back in alignment.    Ambulation/Gait Ambulation/Gait assistance: Contact guard assist Gait Distance (Feet): 20 Feet Assistive device: Crutches Gait Pattern/deviations: Decreased step length - right,  Decreased step length - left       General Gait Details: small steps, 4 point pattern with cruth, cruthc, step step. to assist with upright posture and spine offloading. encouraged a lot of weight bearing in her hands on crutches and this helped some as well.  Stairs Stairs:  (educated mom and patient how to face the raling sideways steps up the steps to get into the house.)          Wheelchair Mobility     Tilt Bed    Modified Rankin (Stroke Patients Only)       Balance Overall balance assessment: Needs assistance Sitting-balance support: Bilateral upper extremity supported                                         Pertinent Vitals/Pain Pain Assessment Pain Assessment: 0-10 Pain Score: 8  Pain Location: 2/10 with resting supine position with spine in nuetral position, however increased to 8/10 with sitting and standing. She stated the way I educated with her mobility the pain was less than previous, and the crutches and abdominal binder help as well. Still not able to function at a normal level but is able to take a few steps in the hallway with me by her side. Pain Descriptors / Indicators: Guarding, Burning, Radiating, Sharp, Spasm Pain Intervention(s): Limited activity within patient's tolerance, Monitored during session, Premedicated before session    Home Living Family/patient expects to be discharged to:: Private residence Living Arrangements: Parent Available Help at Discharge: Family (everyone works) Type of Home: [employer] Home Access: Stairs to enter Entrance Stairs-Rails: Right Entrance Stairs-Number of Steps: 5   Home Layout: One level Home Equipment: BSC/3in1 Additional Comments: issued axillary crutches to help with spine decompression when walking and for support at this time.    Prior Function Prior Level of Function : Independent/Modified Independent             Mobility Comments: just has been making adjustment for her functional  ADLs and mobility down to her back pain severity with forwad flexion and pelviv posterior tilt       Extremity/Trunk Assessment        Lower Extremity Assessment Lower Extremity Assessment: Difficult to assess due to impaired cognition       Communication   Communication Communication: No apparent difficulties  Cognition Arousal: Alert Behavior During Therapy: WFL for tasks assessed/performed Overall Cognitive Status: Within Functional Limits for tasks assessed                                          General Comments      Exercises Other Exercises Other Exercises: educated with log roll in hooklying for sit>Sidelying> supine. Other Exercises: educated with achieving neutral spine position by sitting at edge of chair, making sue hips are above knees when sitting ( use a towel folded  in back of chair  if need), try prone/belly position for 10-20 minutes, and standing upright with spinal decompression while using axilary crutches and pushing through bilateral hands . Other Exercises: educated to use counter pressure with pillow , folded towel or both hands to push against abdominals when sneezing or coughing. Other Exercises: use heat and ice altenating Other Exercises: use abdominal binder for compression to help relieve pain , and use reacher for getting objects, showed them how to sit edge of chair and cross leg to don sock and shoe.   Assessment/Plan    PT Assessment Patient needs continued PT services  PT Problem List Decreased activity tolerance;Decreased mobility       PT Treatment Interventions DME instruction;Therapeutic activities;Functional mobility training;Patient/family education    PT Goals (Current goals can be found in the Care Plan section)  Acute Rehab PT Goals Patient Stated Goal: I want this to be better so I can continue working and doing my daily things PT Goal Formulation: With patient/family Time For Goal Achievement:  11/28/23 Potential to Achieve Goals: Good    Frequency Min 1X/week     Co-evaluation               AM-PAC PT 6 Clicks Mobility  Outcome Measure Help needed turning from your back to your side while in a flat bed without using bedrails?: A Little Help needed moving from lying on your back to sitting on the side of a flat bed without using bedrails?: A Little Help needed moving to and from a bed to a chair (including a wheelchair)?: A Little Help needed standing up from a chair using your arms (e.g., wheelchair or bedside chair)?: A Little Help needed to walk in hospital room?: A Little Help needed climbing 3-5 steps with a railing? : A Little 6 Click Score: 18    End of Session   Activity Tolerance: Patient limited by pain;Patient tolerated treatment well Patient left: in bed;with family/visitor present (in hallway in ED with mom by herside) Nurse Communication: Mobility status PT Visit Diagnosis: Difficulty in walking, not elsewhere classified (R26.2);Pain Pain - part of body:  (back)    Time: 1130-1230 PT Time Calculation (min) (ACUTE ONLY): 60 min   Charges:   PT Evaluation $PT Eval Low Complexity: 1 Low PT Treatments $Gait Training: 8-22 mins $Therapeutic Activity: 23-37 mins PT General Charges $$ ACUTE PT VISIT: 1 Visit         Adaria Hole, PT, MPT Acute Rehabilitation Services Office: [phone] If a weekend: secure chat groups: [facility] PT, [facility] OT, [facility] SLP 11/14/2023   Amybeth Sieg 11/14/2023, 1:40 PM

## 2023-11-14 NOTE — H&P (Signed)
 History and Physical    Patient: Valerie Knapp FMW:[id number] DOB: 1986-03-07 DOA: 11/13/2023 DOS: the patient was seen and examined on 11/14/2023 PCP: Patient, No Pcp Per  Patient coming from: Home  Chief Complaint:  Chief Complaint  Patient presents with   Back Pain   HPI: Valerie Knapp is a 38 y.o. female with medical history significant of migraines, obesity, and reports previous eye surgeries for retinal detachment and cataracts. She presented to [facility] ED yesterday after having difficulty standing and getting off of the toilet yesterday. 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 Approximately two weeks ago she was reaching for a sales tag and twisted and noted increased lower back pain/spasms. She reports she normally is able to stretch and use tiger balm and her lower back pain is back to baseline in a few days. This time she reports the pain has gotten progressively worse over the past two weeks and she was no longer able to tolerate the pain when she describes her back locked up when attempting to stand from the toilet yesterday. She reports 9-10/10 lower back throbbing that intermittently shoots down both legs. When the pain shoots down her legs she describes it as sharp. She was taking Advil at home without relief. In the ED she reports pain relief to 2-3/10 with IV Dilaudid  that is short lived. She denies saddle anesthesia or bowel/bladder incontinence.  During interview Valerie Knapp also reports a non-productive cough and chest congestion that she attributes to post nasal drip. She denies any known sick contacts. Denies fever or chills.   ED Course: On arrival to [facility] ED patient was noted to be afebrile temp 36.6C, BP 174/99, HR 126, RR 18, SpO2 99% on room air and reporting 10/10 lower back pain.  CT L-spine obtained and shows No acute fracture or traumatic listhesis  of the lumbar spine.  MRI of the L-spine obtained and shows L5-S1 large right subarticular disc protrusion with impingement of the descending right S1 nerve roots and severe canal stenosis.  A CT renal stone was also obtained and negative for any kidney stones. Labs notable for WBC 22.5, platelets 482, potassium of 3.1, glucose elevated at 122, and urinalysis without signs of UTI.  In the ED she received Tylenol , dexamethasone , fentanyl , Norco, Dilaudid , Toradol , Robaxin , morphine , Zofran , and 40 mEq of potassium.  After multiple trials of pain management patient was able to ambulate but still with significant pain. [facility] contacted for admission and pain management.  Review of Systems: As mentioned in the history of present illness. All other systems reviewed and are negative. Past Medical History:  Diagnosis Date   Migraine    Past Surgical History:  Procedure Laterality Date   NO PAST SURGERIES     SCLERAL BUCKLE WITH CRYO Right 10/20/2015   Procedure: SCLERAL BUCKLE WITH CRYO, EXTERNAL DRAIN;  Surgeon: Onesimo Blanch, MD;  Location: [facility] OR;  Service: Ophthalmology;  Laterality: Right;   Social History:  reports that she has never smoked. She has never used smokeless tobacco. She reports that she does not drink alcohol and does not use drugs.  Allergies  Allergen Reactions   Benadryl [Diphenhydramine Hcl (Sleep)] Swelling    Family History  Problem Relation Age of Onset   Healthy Mother     Prior to Admission medications   Medication Sig Start Date End Date Taking? Authorizing Provider  cetirizine (  ZYRTEC) 10 MG tablet Take 10 mg by mouth daily.   Yes [provider]  ibuprofen (ADVIL) 200 MG tablet Take 200 mg by mouth every 4 (four) hours as needed for moderate pain (pain score 4-6).   Yes [provider]  lidocaine  (LIDODERM ) 5 % Place 1 patch onto the skin daily. Remove & Discard patch within 12 hours or as directed by MD 11/13/23  Yes Lang Norleen POUR, PA-C   methocarbamol  (ROBAXIN ) 500 MG tablet Take 1 tablet (500 mg total) by mouth 2 (two) times daily. 11/13/23  Yes Robinson, John K, PA-C  nortriptyline  (PAMELOR ) 10 MG capsule One po qhs xone week, then 2 tabs po qhs Patient not taking: Reported on 11/14/2023 10/19/15   Onita Duos, MD  SUMAtriptan  (IMITREX ) 100 MG tablet Take 1 tablet (100 mg total) by mouth once as needed for migraine. May repeat in 2 hours if headache persists or recurs. Patient not taking: Reported on 11/14/2023 09/28/15   Onita Duos, MD    Physical Exam: Vitals:   11/13/23 2130 11/13/23 2300 11/14/23 0200 11/14/23 0320  BP: 136/86  (!) 156/95 (!) 158/89  Pulse: 96  98 (!) 112  Resp: 18  18 16   Temp:  98 F (36.7 C)  98 F (36.7 C)  TempSrc:      SpO2: 100%  100% 100%   Constitutional: NAD, calm, uncomfortable with movement Eyes: PERRL, lids and conjunctivae normal ENMT: Mucous membranes are moist. Posterior pharynx clear of any exudate or lesions. Normal dentition.  Neck: normal, supple, no masses, no thyromegaly Respiratory: clear to auscultation bilaterally, no wheezing, no crackles. Normal respiratory effort. No accessory muscle use.  Cardiovascular: Regular rate and rhythm, no murmurs / rubs / gallops. No extremity edema. 2+ radial and pedal pulses.  Abdomen: no tenderness, no masses palpated. No hepatosplenomegaly. Bowel sounds positive.  Musculoskeletal: no clubbing / cyanosis. No joint deformity upper and lower extremities. Good ROM, no contractures. Normal muscle tone. Lower back is tender to palpation Skin: no rashes, lesions, ulcers.  Neurologic: CN 2-12 grossly intact. Sensation intact, Alert and oriented x3. Psychiatric: Normal judgment and insight. Normal mood.   Data Reviewed: CBC    Component Value Date/Time   WBC 22.5 (H) 11/13/2023 1758   RBC 4.54 11/13/2023 1758   HGB 13.4 11/13/2023 1758   HCT 40.0 11/13/2023 1758   PLT 482 (H) 11/13/2023 1758   MCV 88.1 11/13/2023 1758   MCH 29.5 11/13/2023  1758   MCHC 33.5 11/13/2023 1758   RDW 13.6 11/13/2023 1758      Latest Ref Rng & Units 11/13/2023    5:58 PM  BMP  Glucose 70 - 99 mg/dL 877   BUN 6 - 20 mg/dL 12   Creatinine 9.55 - 1.00 mg/dL 9.27   Sodium 864 - 854 mmol/L 137   Potassium 3.5 - 5.1 mmol/L 3.1   Chloride 98 - 111 mmol/L 107   CO2 22 - 32 mmol/L 20   Calcium 8.9 - 10.3 mg/dL 9.2    Urinalysis    Component Value Date/Time   COLORURINE YELLOW 11/13/2023 1906   APPEARANCEUR CLEAR 11/13/2023 1906   LABSPEC 1.015 11/13/2023 1906   PHURINE 6.0 11/13/2023 1906   GLUCOSEU NEGATIVE 11/13/2023 1906   HGBUR LARGE (A) 11/13/2023 1906   BILIRUBINUR NEGATIVE 11/13/2023 1906   KETONESUR NEGATIVE 11/13/2023 1906   PROTEINUR 30 (A) 11/13/2023 1906   NITRITE NEGATIVE 11/13/2023 1906   LEUKOCYTESUR NEGATIVE 11/13/2023 1906    MR Lumbar Spine  W Wo Contrast Result Date: 11/14/2023 CLINICAL DATA:  Low back pain, cauda equina syndrome suspected EXAM: MRI LUMBAR SPINE WITHOUT AND WITH CONTRAST TECHNIQUE: Multiplanar and multiecho pulse sequences of the lumbar spine were obtained without and with intravenous contrast. CONTRAST:  10mL GADAVIST  GADOBUTROL  1 MMOL/ML IV SOLN COMPARISON:  None Available. FINDINGS: Segmentation:  Standard. Alignment:  No substantial sagittal subluxation. Vertebrae: Degenerative/discogenic endplate signal changes at the L5-S1 level. Conus medullaris and cauda equina: Conus extends to the T12-L1 level. Conus appears normal. Paraspinal and other soft tissues: Unremarkable. Disc levels: T12-L1: No significant disc protrusion, foraminal stenosis, or canal stenosis. L1-L2: No significant disc protrusion, foraminal stenosis, or canal stenosis. L2-L3: No significant disc protrusion, foraminal stenosis, or canal stenosis. L3-L4: No significant disc protrusion, foraminal stenosis, or canal stenosis. L4-L5: Facet arthropathy.  No significant stenosis. L5-S1: Disc height loss and desiccation. Large right subarticular disc  protrusion with severe right subarticular recess stenosis and impingement of the descending right S1 nerve roots. Severe canal stenosis. Patent foramina. IMPRESSION: At L5-S1, large right subarticular disc protrusion with impingement of the descending right S1 nerve roots and severe canal stenosis. Electronically Signed   By: Gilmore GORMAN Molt M.D.   On: 11/14/2023 00:35   CT L-SPINE NO CHARGE Result Date: 11/13/2023 CLINICAL DATA:  Pt reports ongoing lower back pain and spasms for the past two weeks. EXAM: CT LUMBAR SPINE WITHOUT CONTRAST TECHNIQUE: Multidetector CT imaging of the lumbar spine was performed without intravenous contrast administration. Multiplanar CT image reconstructions were also generated. RADIATION DOSE REDUCTION: This exam was performed according to the departmental dose-optimization program which includes automated exposure control, adjustment of the mA and/or kV according to patient size and/or use of iterative reconstruction technique. COMPARISON:  None Available. FINDINGS: Segmentation: 5 lumbar type vertebrae. Alignment: Normal. Vertebrae: No acute fracture or focal pathologic process. Paraspinal and other soft tissues: Negative. Disc levels: Maintained. IMPRESSION: No acute displaced fracture or traumatic listhesis of the lumbar spine. Electronically Signed   By: Morgane  Naveau M.D.   On: 11/13/2023 21:29   CT Renal Stone Study Result Date: 11/13/2023 CLINICAL DATA:  Abdominal/flank pain, stone suspected Pt reports ongoing lower back pain and spasms for the past two weeks. EXAM: CT ABDOMEN AND PELVIS WITHOUT CONTRAST TECHNIQUE: Multidetector CT imaging of the abdomen and pelvis was performed following the standard protocol without IV contrast. RADIATION DOSE REDUCTION: This exam was performed according to the departmental dose-optimization program which includes automated exposure control, adjustment of the mA and/or kV according to patient size and/or use of iterative reconstruction  technique. COMPARISON:  None Available. FINDINGS: Lower chest: No acute abnormality. Hepatobiliary: No focal liver abnormality. No gallstones, gallbladder wall thickening, or pericholecystic fluid. No biliary dilatation. Pancreas: No focal lesion. Normal pancreatic contour. No surrounding inflammatory changes. No main pancreatic ductal dilatation. Spleen: Normal in size without focal abnormality. Adrenals/Urinary Tract: No adrenal nodule bilaterally. No nephrolithiasis and no hydronephrosis. No definite contour-deforming renal mass. No ureterolithiasis or hydroureter. The urinary bladder is unremarkable. Stomach/Bowel: Stomach is within normal limits. No evidence of bowel wall thickening or dilatation. Appendix appears normal. Vascular/Lymphatic: No abdominal aorta or iliac aneurysm. No abdominal, pelvic, or inguinal lymphadenopathy. Reproductive: Uterus and bilateral adnexa are unremarkable. Other: No intraperitoneal free fluid. No intraperitoneal free gas. No organized fluid collection. Musculoskeletal: No abdominal wall hernia or abnormality. No suspicious lytic or blastic osseous lesions. No acute displaced fracture. Please see separately dictated CT lumbar spine 11/13/2023. IMPRESSION: 1. No acute intra-abdominal or intrapelvic abnormality with limited evaluation  on this noncontrast study. 2. Please see separately dictated CT lumbar spine 11/13/2023 Electronically Signed   By: Morgane  Naveau M.D.   On: 11/13/2023 21:28   Assessment and Plan: #Lower Back Pain #L5-S1 Disc Protrusion and nerve root Impingement #Severe Canal Stenosis - Multimodal pain control with scheduled Toradol , Zanaflex , and Lidocaine  patch. PRN Tylenol , Oxycodone , and IV Dilaudid . - PT consult - Neurosurgery consulted, appreciate their recommendations and management  #Leukocytosis Patient reporting cough and chest congestion that she attributes to post nasal drip. Denies any known sick contacts. - CXR - COVID, Flu, RSV - UA in  ED without signs of infection  #Hyperglycemia - Add HGB A1C to AM labs  #Hypokalemia - Repleted in ED, replete PRN - Recheck BMP with AM labs  #Obesity This complicates overall care and prognosis - Information for [facility] placed on AVS  Advance Care Planning:   Code Status: Full Code   Consults: Neurosurgery  Family Communication: No family at bedside, patient able to independently update family.  Severity of Illness: The appropriate patient status for this patient is OBSERVATION. Observation status is judged to be reasonable and necessary in order to provide the required intensity of service to ensure the patient's safety. The patient's presenting symptoms, physical exam findings, and initial radiographic and laboratory data in the context of their medical condition is felt to place them at decreased risk for further clinical deterioration. Furthermore, it is anticipated that the patient will be medically stable for discharge from the hospital within 2 midnights of admission.   To reach the provider On-Call:   7AM- 7PM see care teams to locate the attending and reach out to them via [email or web address]. Password: [email or web address] 7PM-7AM contact night-coverage If you still have difficulty reaching the appropriate provider, please page the [facility] (Director on Call) for [facility] on amion for assistance  This document was prepared using Conservation officer, historic buildings and may include unintentional dictation errors.  Rockie Rams FNP-BC, PMHNP-BC Nurse Practitioner [facility]

## 2023-11-14 NOTE — ED Notes (Signed)
 Called [facility]. States transport will be sent ASAP

## 2023-11-14 NOTE — ED Provider Notes (Signed)
  Physical Exam  BP (!) 158/89   Pulse (!) 112   Temp 98 F (36.7 C)   Resp 16   SpO2 100%   Physical Exam Vitals and nursing note reviewed.  Constitutional:      General: She is not in acute distress.    Appearance: She is not toxic-appearing.  HENT:     Head: Normocephalic and atraumatic.  Pulmonary:     Effort: No respiratory distress.  Skin:    Coloration: Skin is not jaundiced or pale.  Neurological:     General: No focal deficit present.     Mental Status: She is alert and oriented to person, place, and time.     GCS: GCS eye subscore is 4. GCS verbal subscore is 5. GCS motor subscore is 6.     Cranial Nerves: Cranial nerves 2-12 are intact. No cranial nerve deficit.     Sensory: Sensation is intact. No sensory deficit.     Motor: Motor function is intact. No weakness.     Comments: 5 out of 5 strength of bilateral lower extremities  Psychiatric:        Behavior: Behavior normal.     Procedures  Procedures  ED Course / MDM   Clinical Course as of 11/14/23 0509  Thu Nov 13, 2023  1944 CT Renal Stone Study [JR]  2257 CT L-SPINE NO CHARGE [JR]  2329 MRI pending, looking for cauda equina vs infection. Unable to ambulate. Pain more well controlled at this point. If MRI negative --> patient can discharge home. No distal weakness. No IVDU.  [CG]  2331 Occurred after doing bend and twist at [employer]. Now here with trouble walking. Pain is reason for decreased ability to ambulate.  [CG]    Clinical Course User Index [CG] Ruthell Lonni FALCON, PA-C [JR] Lang Norleen POUR, PA-C   Medical Decision Making Amount and/or Complexity of Data Reviewed Labs: ordered. Radiology: ordered. Decision-making details documented in ED Course.  Risk OTC drugs. Prescription drug management. Decision regarding hospitalization.   38 year old female signed out to me at shift change pending MRI.  Please see the previous provider note for further details.  On exam patient has 5  out of 5 strength of bilateral lower extremities.  She is laying supine in triage bed.  Patient states she is unable to get up secondary to back pain.  Denies any red flag symptoms of low back pain.  Will follow-up on MRI imaging.  Patient has L5-S1, large right subarticular disc protrusion with impingement of the descending right S1 nerve root and severe canal stenosis.  Will attempt to control patient pain and have her follow-up outpatient with neurosurgery.  Patient provided 1 mg Dilaudid  for pain.  Patient ambulated at this time to the bathroom however nursing staff and techs notified me that the patient was unable to get off of the bathroom toilet secondary to back pain.  Patient crying out, screaming.  Had attending Dr. Midge evaluate patient.  He has recommended admission for pain control.  Will provide patient fentanyl  and Decadron .  Patient discussed with Dr. Marcene, [facility] hospitalist, who has agreed to admit the patient for pain control.         Ruthell Lonni FALCON, PA-C 11/14/23 9490    Midge Golas, MD 11/14/23 [phone]

## 2023-11-15 DIAGNOSIS — M545 Low back pain, unspecified: Secondary | ICD-10-CM | POA: Diagnosis not present

## 2023-11-15 LAB — BASIC METABOLIC PANEL
Anion gap: 7 (ref 5–15)
BUN: 18 mg/dL (ref 6–20)
CO2: 22 mmol/L (ref 22–32)
Calcium: 8.9 mg/dL (ref 8.9–10.3)
Chloride: 108 mmol/L (ref 98–111)
Creatinine, Ser: 0.82 mg/dL (ref 0.44–1.00)
GFR, Estimated: >60 mL/min (ref >60)
Glucose, Bld: 103 mg/dL — ABNORMAL HIGH (ref 70–99)
Potassium: 3.9 mmol/L (ref 3.5–5.1)
Sodium: 137 mmol/L (ref 135–145)

## 2023-11-15 LAB — CBC
HCT: 36.5 % (ref 36.0–46.0)
Hemoglobin: 11.6 g/dL — ABNORMAL LOW (ref 12.0–15.0)
MCH: 28.4 pg (ref 26.0–34.0)
MCHC: 31.8 g/dL (ref 30.0–36.0)
MCV: 89.2 fL (ref 80.0–100.0)
Platelets: 379 10*3/uL (ref 150–400)
RBC: 4.09 MIL/uL (ref 3.87–5.11)
RDW: 14.4 % (ref 11.5–15.5)
WBC: 16.1 10*3/uL — ABNORMAL HIGH (ref 4.0–10.5)
nRBC: 0 % (ref 0.0–0.2)

## 2023-11-15 LAB — HEMOGLOBIN A1C
Hgb A1c MFr Bld: 5.4 % (ref 4.8–5.6)
Mean Plasma Glucose: 108 mg/dL

## 2023-11-15 LAB — MAGNESIUM: Magnesium: 2.4 mg/dL (ref 1.7–2.4)

## 2023-11-15 MED ORDER — CAMPHOR-MENTHOL 0.5-0.5 % EX LOTN
TOPICAL_LOTION | CUTANEOUS | Status: DC | PRN
  Filled 2023-11-15: qty 222

## 2023-11-15 MED ORDER — HYDROMORPHONE HCL 1 MG/ML IJ SOLN: 0.5000 mg | INTRAMUSCULAR | Status: DC | PRN

## 2023-11-15 MED ORDER — GABAPENTIN 100 MG PO CAPS
200.0000 mg | ORAL_CAPSULE | Freq: Three times a day (TID) | ORAL | Status: DC
  Administered 2023-11-15 – 2023-11-18 (×9): 200 mg via ORAL
  Filled 2023-11-15 (×9): qty 2

## 2023-11-15 NOTE — Evaluation (Signed)
 Occupational Therapy Evaluation Patient Details Name: Valerie Knapp MRN: [id number] DOB: 10-Jan-1986 Today's Date: 11/15/2023   History of Present Illness pt in ED 38 y/o who works at [employer] and has to lift heavy boxes, reachign and twisting. She began experiencing some back pain about 3 years ago, however got more pronounced after a few weeks ago when she was bending and twisting to reach something. Since then it has gotten progressively worse with difficult sitting and standing up from the commode without severe back pain once standing. Her pain is on the ight side of he back, into her right glut area, as well as some on the left low back area and some numbness (  right side a lot worse than left). MRI shows L5 -S1 Disc height loss and desiccation. Large right subarticular  disc protrusion with severe right subarticular recess stenosis and  impingement of the descending right S1 nerve roots. Severe canal  stenosis.   Clinical Impression   Pt presents with decline in function and safety with ADLs and ADL mobility with impaired balance and endurance; limited by back pain. PTA pt was Ind with ADLs/selfcare, IADLs, home mgt, drives, works. Pt currently requires mod A with LB ADLs and Sup with toilet transfers; pt using crutched Mod I for mobility.  Family/pt educated on body mechanics, ADL A/E with handouts provided. Pt would benefit from acute OT services to address impairments to maximize level of function and safety      If plan is discharge home, recommend the following: A little help with bathing/dressing/bathroom;Assistance with cooking/housework    Functional Status Assessment  Patient has had a recent decline in their functional status and demonstrates the ability to make significant improvements in function in a reasonable and predictable amount of time.  Equipment Recommendations  BSC/3in1;Tub/shower seat;Other (comment) (ADL A/E kit, toileting aid)    Recommendations for Other  Services       Precautions / Restrictions Precautions Precautions: Back Precaution Booklet Issued: Yes (comment) Precaution Comments: instructed in precautions to minimize pain Required Braces or Orthoses: Spinal Brace Spinal Brace: Lumbar corset;Applied in standing position Restrictions Weight Bearing Restrictions Per Provider Order: No      Mobility Bed Mobility Overal bed mobility: Needs Assistance Bed Mobility: Sidelying to Sit, Rolling, Sit to Sidelying Rolling: Supervision, Used rails Sidelying to sit: Min assist, Used rails     Sit to sidelying: Supervision      Transfers Overall transfer level: Modified independent Equipment used: Crutches                      Balance                                           ADL either performed or assessed with clinical judgement   ADL Overall ADL's : Needs assistance/impaired Eating/Feeding: Independent   Grooming: Wash/dry hands;Wash/dry face;Supervision/safety;Standing   Upper Body Bathing: Set up;Sitting   Lower Body Bathing: Minimal assistance   Upper Body Dressing : Set up;Sitting   Lower Body Dressing: Moderate assistance   Toilet Transfer: Radiographer, Therapeutic Details (indicate cue type and reason): crutches Toileting- Clothing Manipulation and Hygiene: Supervision/safety       Functional mobility during ADLs: Supervision/safety;Modified independent General ADL Comments: pt educated on body mechanics, ADL A/E with handouts provided     Vision Baseline Vision/History: 1 Wears glasses Ability to See  in Adequate Light: 0 Adequate Patient Visual Report: No change from baseline       Perception         Praxis         Pertinent Vitals/Pain Pain Assessment Pain Assessment: 0-10 Pain Score: 5  Pain Location: back Pain Descriptors / Indicators: Dull, Throbbing Pain Intervention(s): Monitored during session, Premedicated before session, Repositioned, Limited  activity within patient's tolerance     Extremity/Trunk Assessment Upper Extremity Assessment Upper Extremity Assessment: Overall WFL for tasks assessed   Lower Extremity Assessment Lower Extremity Assessment: Defer to PT evaluation       Communication Communication Communication: No apparent difficulties   Cognition Arousal: Alert Behavior During Therapy: WFL for tasks assessed/performed Overall Cognitive Status: Within Functional Limits for tasks assessed                                       General Comments  Patient assisted to restroom prior to ambulation. She required assist to doff and then don her underwear (wanted to change them). Educated to start her underwear while seated. States her mother can help her at home.    Exercises     Shoulder Instructions      Home Living Family/patient expects to be discharged to:: Private residence Living Arrangements: Parent Available Help at Discharge: Family Type of Home: House Home Access: Stairs to enter Secretary/administrator of Steps: 5 Entrance Stairs-Rails: Right Home Layout: One level     Bathroom Shower/Tub: Producer, Television/film/video: Standard Bathroom Accessibility: Yes   Home Equipment: BSC/3in1   Additional Comments: issued axillary crutches to help with spine decompression when walking and for support at this time.      Prior Functioning/Environment Prior Level of Function : Independent/Modified Independent             Mobility Comments: just has been making adjustment for her functional ADLs and mobility down to her back pain severity with forwad flexion and pelviv posterior tilt ADLs Comments: Ind, works        OT Problem List: Pain;Impaired balance (sitting and/or standing);Decreased activity tolerance;Decreased knowledge of use of DME or AE      OT Treatment/Interventions: Self-care/ADL training;DME and/or AE instruction;Patient/family education    OT Goals(Current  goals can be found in the care plan section) Acute Rehab OT Goals Patient Stated Goal: less pain OT Goal Formulation: With patient/family Time For Goal Achievement: 11/29/23 Potential to Achieve Goals: Good ADL Goals Pt Will Perform Grooming: with set-up;with modified independence;standing Pt Will Perform Lower Body Bathing: with min assist;with supervision;with adaptive equipment Pt Will Perform Lower Body Dressing: with min assist;with supervision;with adaptive equipment Pt Will Transfer to Toilet: with modified independence Pt Will Perform Toileting - Clothing Manipulation and hygiene: with modified independence;sit to/from stand  OT Frequency: Min 1X/week    Co-evaluation              AM-PAC OT 6 Clicks Daily Activity     Outcome Measure Help from another person eating meals?: None Help from another person taking care of personal grooming?: A Little Help from another person toileting, which includes using toliet, bedpan, or urinal?: A Little Help from another person bathing (including washing, rinsing, drying)?: A Little Help from another person to put on and taking off regular upper body clothing?: A Little Help from another person to put on and taking off regular lower body clothing?:  A Little 6 Click Score: 19   End of Session Equipment Utilized During Treatment: Other (comment) (crutches)  Activity Tolerance: Patient limited by pain Patient left: in bed;with call bell/phone within reach;with family/visitor present  OT Visit Diagnosis: Other abnormalities of gait and mobility (R26.89);Pain Pain - part of body:  (back)                Time: 8852-8789 OT Time Calculation (min): 23 min Charges:  OT General Charges $OT Visit: 1 Visit OT Evaluation $OT Eval Low Complexity: 1 Low OT Treatments $Therapeutic Activity: 8-22 mins    Jacques Karna Loose 11/15/2023, 12:27 PM

## 2023-11-15 NOTE — Consult Note (Signed)
 Chief Complaint   Chief Complaint  Patient presents with   Back Pain    History of Present Illness  Valerie Knapp is a 38 y.o. female transferred to [facility] after presenting with a few days of acute onset right sided back pain.  Patient was at work, bending over and twisted, and noted sudden onset of midline and right-sided back pain.  After about 1 day, she felt a little bit better, went back to work but was unable to stand for more than an hour.  At that point pain became so severe she called EMS to bring her to the hospital directly from work.  She is currently complaining of midline back pain radiating out towards the right side of her back, with radiation primarily through the right buttock and posterior thigh when standing.  She reports minimal pain while lying in bed.  No significant left leg pain.  No changes in bladder function.  She does not report any paresthesias in the lower extremities.  She has been medicated since being in the hospital and was given a lumbar corset.  She feels this is providing some improvement in symptoms.  Of note, the patient denies any significant medical history with the exception of multiple previous eye surgeries including a retinal detachment.  Specifically, no history of hypertension, diabetes, heart disease or previous stroke.  No known lung, liver, kidney disease.  She is not on any blood thinners or antiplatelet agents.  She is a non-smoker.  Past Medical History   Past Medical History:  Diagnosis Date   Migraine     Past Surgical History   Past Surgical History:  Procedure Laterality Date   NO PAST SURGERIES     SCLERAL BUCKLE WITH CRYO Right 10/20/2015   Procedure: SCLERAL BUCKLE WITH CRYO, EXTERNAL DRAIN;  Surgeon: Onesimo Blanch, MD;  Location: [facility] OR;  Service: Ophthalmology;  Laterality: Right;    Social History   Social History   Tobacco Use   Smoking status: Never   Smokeless tobacco: Never   Substance Use Topics   Alcohol use: No   Drug use: No    Medications   Prior to Admission medications   Medication Sig Start Date End Date Taking? Authorizing Provider  cetirizine (ZYRTEC) 10 MG tablet Take 10 mg by mouth daily.   Yes [provider]  ibuprofen (ADVIL) 200 MG tablet Take 200 mg by mouth every 4 (four) hours as needed for moderate pain (pain score 4-6).   Yes [provider]  lidocaine  (LIDODERM ) 5 % Place 1 patch onto the skin daily. Remove & Discard patch within 12 hours or as directed by MD 11/13/23  Yes Lang Norleen POUR, PA-C  methocarbamol  (ROBAXIN ) 500 MG tablet Take 1 tablet (500 mg total) by mouth 2 (two) times daily. 11/13/23  Yes Robinson, John K, PA-C  nortriptyline  (PAMELOR ) 10 MG capsule One po qhs xone week, then 2 tabs po qhs Patient not taking: Reported on 11/14/2023 10/19/15   Onita Duos, MD  SUMAtriptan  (IMITREX ) 100 MG tablet Take 1 tablet (100 mg total) by mouth once as needed for migraine. May repeat in 2 hours if headache persists or recurs. Patient not taking: Reported on 11/14/2023 09/28/15   Onita Duos, MD    Allergies   Allergies  Allergen Reactions   Benadryl [Diphenhydramine Hcl (Sleep)] Swelling    Review of Systems  ROS  Neurologic Exam  Awake, alert, oriented Memory and concentration grossly intact Speech fluent, appropriate  CN grossly intact Motor exam: Upper Extremities Deltoid Bicep Tricep Grip  Right 5/5 5/5 5/5 5/5  Left 5/5 5/5 5/5 5/5   Lower Extremities IP Quad PF DF EHL  Right 5/5 5/5 5/5 5/5 5/5  Left 5/5 5/5 5/5 5/5 5/5   Sensation grossly intact to LT  Imaging  MRI of the lumbar spine dated 11/14/2023 was personally reviewed.  This demonstrates primary finding at L5-S1 where there is a large right eccentric disc herniation with associated central and severe right-sided lateral recess stenosis.  Impression  - 38 y.o. 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  Given her age and acuity of symptoms I do think an attempt at conservative treatment is reasonable.  Plan  -Continue current pain regimen -Patient can continue to utilize the lumbar corset for comfort -Will get physical and Occupational Therapy for mobilization -If patient is able to ambulate with tolerable symptoms, can consider discharge home.  Alternatively, if she is unable to ambulate despite adequate pain medicine, could then consider operative decompression via a right sided L5-S1 laminotomy microdiscectomy.  I have reviewed the situation with the patient and her parents at bedside.  We have reviewed the MRI findings and general treatment options for lumbar disc herniations.  For the reasons above, I did recommend continued conservative treatment.  All their questions today were answered.  The patient is willing to proceed as above.   Gerldine Maizes, MD [facility]

## 2023-11-15 NOTE — Progress Notes (Signed)
 Physical Therapy Treatment Patient Details Name: Valerie Knapp MRN: [id number] DOB: December 29, 1985 Today's Date: 11/15/2023   History of Present Illness pt in ED 38 y/o who works at [employer] and has to lift heavy boxes, reachign and twisting. She began experiencing some back pain about 3 years ago, however got more pronounced after a few weeks ago when she was bending and twisting to reach something. Since then it has gotten progressively worse with difficult sitting and standign up from the commode without severe back pain once standing. Her pain is on the ight side of he back, into her right glut area, as well as some on the left low back area and some numbness (  right side a lot worse than left). MRI shows L5 -S1 Disc height loss and desiccation. Large right subarticular  disc protrusion with severe right subarticular recess stenosis and  impingement of the descending right S1 nerve roots. Severe canal  stenosis.    PT Comments  Patient reporting has not walked other than going to the bathroom as she did not know what was wrong with her back and did not want to make it worse. Dr Lanis in this morning to explain to her. She continues to require min assist with bed mobility (even with use of rail). She was able to ambulate 110 ft with crutches with supervision (occasional cues for sequencing and crutch placement).  She did not make it far enough to practice the steps, which she has 6 to enter house with rail. Patient reports PTA she has been going sideways up/down with rail and it has not been very painful at all.    If plan is discharge home, recommend the following: Assist for transportation;Help with stairs or ramp for entrance;Assistance with cooking/housework   Can travel by private vehicle        Equipment Recommendations  None recommended by PT    Recommendations for Other Services OT consult     Precautions / Restrictions Precautions Precautions: Back Precaution Comments:  instructed in precautions to minimize pain Required Braces or Orthoses: Spinal Brace (abdominal binder for some compression to relieve some of the posterior back pain.) Restrictions Weight Bearing Restrictions Per Provider Order: No     Mobility  Bed Mobility Overal bed mobility: Needs Assistance Bed Mobility: Sidelying to Sit, Rolling Rolling: Supervision, Used rails Sidelying to sit: Min assist, Used rails       General bed mobility comments: pt recalled correct steps for supine to side to sit; required assist to raise her torso even with using rail    Transfers Overall transfer level: Modified independent Equipment used: Crutches               General transfer comment: just cues on how to minimize the back pain and to help offload spine with bilateral crutches once standing. encouaged upright standing instead of forward leaning, even though forward leaning takes the pressure off her pain and disc, we are trying to get her upright again  and back in alignment.    Ambulation/Gait Ambulation/Gait assistance: Contact guard assist Gait Distance (Feet): 110 Feet Assistive device: Crutches Gait Pattern/deviations: Decreased step length - right, Decreased step length - left   Gait velocity interpretation: 1.31 - 2.62 ft/sec, indicative of limited community ambulator   General Gait Details: small steps, 4 point pattern with crutch, crutch, step step. to assist with upright posture and spine offloading. Occasional cue for slightly wider spacing of crutches to improve balance and control.  encouraged a  lot of weight bearing in her hands on crutches and this helped some as well. Good job of not leaning on her axillae   Stairs Stairs:  (pt states up sideways does not hurt as much as other things)           Wheelchair Mobility     Tilt Bed    Modified Rankin (Stroke Patients Only)       Balance Overall balance assessment: Needs assistance Sitting-balance support: No  upper extremity supported, Feet supported Sitting balance-Leahy Scale: Fair Sitting balance - Comments: >fair NT to minimize back pain/strain   Standing balance support: Bilateral upper extremity supported, During functional activity Standing balance-Leahy Scale: Fair Standing balance comment: using DME to offload spine to decr pain, not for balance                            Cognition Arousal: Alert Behavior During Therapy: WFL for tasks assessed/performed Overall Cognitive Status: Within Functional Limits for tasks assessed                                          Exercises      General Comments General comments (skin integrity, edema, etc.): Patient assisted to restroom prior to ambulation. She required assist to doff and then don her underwear (wanted to change them). Educated to start her underwear while seated. States her mother can help her at home.      Pertinent Vitals/Pain Pain Assessment Pain Assessment: 0-10 Pain Score: 7  Pain Location: 5/10 with resting supine position with spine in nuetral position, however increased to 7/10 with sitting and standing. 5/10 with ambulation Pain Descriptors / Indicators: Guarding, Burning, Radiating, Sharp, Spasm Pain Intervention(s): Limited activity within patient's tolerance, Monitored during session, Premedicated before session, Repositioned    Home Living                          Prior Function            PT Goals (current goals can now be found in the care plan section) Acute Rehab PT Goals Patient Stated Goal: I want this to be better so I can continue working and doing my daily things PT Goal Formulation: With patient/family Time For Goal Achievement: 11/28/23 Potential to Achieve Goals: Good Progress towards PT goals: Progressing toward goals;Goals updated    Frequency    Min 1X/week      PT Plan      Co-evaluation              AM-PAC PT 6 Clicks Mobility    Outcome Measure  Help needed turning from your back to your side while in a flat bed without using bedrails?: A Little Help needed moving from lying on your back to sitting on the side of a flat bed without using bedrails?: A Little Help needed moving to and from a bed to a chair (including a wheelchair)?: A Little Help needed standing up from a chair using your arms (e.g., wheelchair or bedside chair)?: A Little Help needed to walk in hospital room?: A Little Help needed climbing 3-5 steps with a railing? : A Little 6 Click Score: 18    End of Session Equipment Utilized During Treatment: Gait belt;Other (comment) (abdominal binder) Activity Tolerance: Patient limited by pain;Patient tolerated treatment well  Patient left: with nursing/sitter in room;with family/visitor present (with RN up at sink to bathe) Nurse Communication: Mobility status PT Visit Diagnosis: Difficulty in walking, not elsewhere classified (R26.2);Pain Pain - Right/Left: Right Pain - part of body:  (back)     Time: 1111-1130 PT Time Calculation (min) (ACUTE ONLY): 19 min  Charges:    $Gait Training: 8-22 mins PT General Charges $$ ACUTE PT VISIT: 1 Visit                      Valerie RAMAN, PT Acute Rehabilitation Services  Office [phone]    Valerie Knapp 11/15/2023, 11:51 AM

## 2023-11-15 NOTE — Progress Notes (Signed)
 PROGRESS NOTE  Valerie Knapp  FMW:[id number] DOB: Mar 21, 1986 DOA: 11/13/2023 PCP: Patient, No Pcp Per   Brief Narrative: Patient is a 38 year old female with history of migraine, obesity who presented with severe low back pain.  MRI lumbar spine showed significant disc protrusion and severe canal stenosis.  Neurosurgery consulted.  Currently trying for conservative management before deciding on operative management.  PT/OT consulted  Assessment & Plan:  Principal Problem:   Acute low back pain Active Problems:   Hypokalemia  Acute low back pain: MRI as above.  Neurosurgery consulted.  Trying conservative management with pain management, supportive care, PT evaluation done, recommended outpatient follow-up.  Continue current pain medication, lidocaine  patch.  Back pain is better today.  Leukocytosis: Unclear etiology.  Could be reactive.  Chest x-ray did not show pneumonia.  UA not suspicious for UTI.  Respiratory viral panel negative.  Continue to monitor  Hypokalemia: Supplemented with potassium.  Morbid obesity:BMI 42. we recommend regular exercise and healthy diet.  This problem is most likely contributing to the principal problem         DVT prophylaxis:SCDs Start: 11/14/23 0458     Code Status: Full Code  Family Communication: Mother at bedside  Patient status:Inpatient  Patient is from :home  Anticipated discharge un:ynfz  Estimated DC date: likely tomorrow   Consultants: Neuro surgery  Procedures: None  Antimicrobials:  Anti-infectives (From admission, onward)    None       Subjective: Patient seen and examined at the bedside today.  Hemodynamically stable.  She did work with the physical therapist.  Back pain is better today but not entirely gone.  Overall looks comfortable.  Hopeful for discharge tomorrow to home  Objective: Vitals:   11/15/23 0021 11/15/23 0305 11/15/23 0351 11/15/23 0757  BP:   (!) 111/56 126/69  Pulse:   70 64  Resp:   16  16  Temp:   97.8 F (36.6 C) 97.8 F (36.6 C)  TempSrc:   Oral Oral  SpO2:   100% 100%  Weight: 118.2 kg 118.2 kg    Height: 5' 6 (1.676 m)       Intake/Output Summary (Last 24 hours) at 11/15/2023 1109 Last data filed at 11/14/2023 2058 Gross per 24 hour  Intake 120 ml  Output --  Net 120 ml   Filed Weights   11/15/23 0021 11/15/23 0305  Weight: 118.2 kg 118.2 kg    Examination:  General exam: Overall comfortable, not in distress, morbidly obese HEENT: PERRL Respiratory system:  no wheezes or crackles  Cardiovascular system: S1 & S2 heard, RRR.  Gastrointestinal system: Abdomen is nondistended, soft and nontender.  Central nervous system: Alert and oriented Extremities: No edema, no clubbing ,no cyanosis Skin: No rashes, no ulcers,no icterus     Data Reviewed: I have personally reviewed following labs and imaging studies  CBC: Recent Labs  Lab 11/13/23 1758 11/14/23 1125 11/15/23 0631  WBC 22.5* 17.4* 16.1*  NEUTROABS  --  16.2*  --   HGB 13.4 13.6 11.6*  HCT 40.0 42.6 36.5  MCV 88.1 88.8 89.2  PLT 482* 503* 379   Basic Metabolic Panel: Recent Labs  Lab 11/13/23 1758 11/14/23 1125 11/15/23 0631  NA 137 136 137  K 3.1* 3.2* 3.9  CL 107 105 108  CO2 20* 21* 22  GLUCOSE 122* 157* 103*  BUN 12 9 18   CREATININE 0.72 0.64 0.82  CALCIUM 9.2 9.4 8.9  MG  --  2.3 2.4  Recent Results (from the past 240 hours)  SARS Coronavirus 2 by RT PCR (hospital order, performed in [facility] hospital lab) *cepheid single result test* Anterior Nasal Swab     Status: None   Collection Time: 11/14/23 11:20 AM   Specimen: Anterior Nasal Swab  Result Value Ref Range Status   SARS Coronavirus 2 by RT PCR NEGATIVE NEGATIVE Final    Comment: (NOTE) SARS-CoV-2 target nucleic acids are NOT DETECTED.  The SARS-CoV-2 RNA is generally detectable in upper and lower respiratory specimens during the acute phase of infection. The lowest concentration of SARS-CoV-2 viral copies  this assay can detect is 250 copies / mL. A negative result does not preclude SARS-CoV-2 infection and should not be used as the sole basis for treatment or other patient management decisions.  A negative result may occur with improper specimen collection / handling, submission of specimen other than nasopharyngeal swab, presence of viral mutation(s) within the areas targeted by this assay, and inadequate number of viral copies (<250 copies / mL). A negative result must be combined with clinical observations, patient history, and epidemiological information.  Fact Sheet for Patients:   roadlaptop.co.za  Fact Sheet for Healthcare Providers: http://kim-miller.com/  This test is not yet approved or  cleared by the [place]  FDA and has been authorized for detection and/or diagnosis of SARS-CoV-2 by FDA under an Emergency Use Authorization (EUA).  This EUA will remain in effect (meaning this test can be used) for the duration of the COVID-19 declaration under Section 564(b)(1) of the Act, 21 U.S.C. section 360bbb-3(b)(1), unless the authorization is terminated or revoked sooner.  Performed at [facility], [address] [place], [place] [address]   Respiratory (~20 pathogens) panel by PCR     Status: None   Collection Time: 11/14/23 11:20 AM   Specimen: Anterior Nasal Swab; Respiratory  Result Value Ref Range Status   Adenovirus NOT DETECTED NOT DETECTED Final   Coronavirus 229E NOT DETECTED NOT DETECTED Final    Comment: (NOTE) The Coronavirus on the Respiratory Panel, DOES NOT test for the novel  Coronavirus (2019 nCoV)    Coronavirus HKU1 NOT DETECTED NOT DETECTED Final   Coronavirus NL63 NOT DETECTED NOT DETECTED Final   Coronavirus OC43 NOT DETECTED NOT DETECTED Final   Metapneumovirus NOT DETECTED NOT DETECTED Final   Rhinovirus / Enterovirus NOT DETECTED NOT DETECTED Final   Influenza A NOT DETECTED NOT  DETECTED Final   Influenza B NOT DETECTED NOT DETECTED Final   Parainfluenza Virus 1 NOT DETECTED NOT DETECTED Final   Parainfluenza Virus 2 NOT DETECTED NOT DETECTED Final   Parainfluenza Virus 3 NOT DETECTED NOT DETECTED Final   Parainfluenza Virus 4 NOT DETECTED NOT DETECTED Final   Respiratory Syncytial Virus NOT DETECTED NOT DETECTED Final   Bordetella pertussis NOT DETECTED NOT DETECTED Final   Bordetella Parapertussis NOT DETECTED NOT DETECTED Final   Chlamydophila pneumoniae NOT DETECTED NOT DETECTED Final   Mycoplasma pneumoniae NOT DETECTED NOT DETECTED Final    Comment: Performed at [facility], [address], [place], [place] [address]     Radiology Studies: DG CHEST PORT 1 VIEW Result Date: 11/14/2023 CLINICAL DATA:  Chest congestion.  Recent low back injury. EXAM: PORTABLE CHEST 1 VIEW COMPARISON:  None Available. FINDINGS: Apical lordotic positioning. Midline trachea.  Normal heart size and mediastinal contours. Sharp costophrenic angles.  No pneumothorax.  Clear lungs. IMPRESSION: No active disease. Electronically Signed   By: Rockey Marthann HERO.D.  On: 11/14/2023 09:59   MR Lumbar Spine W Wo Contrast Result Date: 11/14/2023 CLINICAL DATA:  Low back pain, cauda equina syndrome suspected EXAM: MRI LUMBAR SPINE WITHOUT AND WITH CONTRAST TECHNIQUE: Multiplanar and multiecho pulse sequences of the lumbar spine were obtained without and with intravenous contrast. CONTRAST:  10mL GADAVIST  GADOBUTROL  1 MMOL/ML IV SOLN COMPARISON:  None Available. FINDINGS: Segmentation:  Standard. Alignment:  No substantial sagittal subluxation. Vertebrae: Degenerative/discogenic endplate signal changes at the L5-S1 level. Conus medullaris and cauda equina: Conus extends to the T12-L1 level. Conus appears normal. Paraspinal and other soft tissues: Unremarkable. Disc levels: T12-L1: No significant disc protrusion, foraminal stenosis, or canal stenosis. L1-L2: No significant disc protrusion, foraminal  stenosis, or canal stenosis. L2-L3: No significant disc protrusion, foraminal stenosis, or canal stenosis. L3-L4: No significant disc protrusion, foraminal stenosis, or canal stenosis. L4-L5: Facet arthropathy.  No significant stenosis. L5-S1: Disc height loss and desiccation. Large right subarticular disc protrusion with severe right subarticular recess stenosis and impingement of the descending right S1 nerve roots. Severe canal stenosis. Patent foramina. IMPRESSION: At L5-S1, large right subarticular disc protrusion with impingement of the descending right S1 nerve roots and severe canal stenosis. Electronically Signed   By: Gilmore GORMAN Molt M.D.   On: 11/14/2023 00:35   CT L-SPINE NO CHARGE Result Date: 11/13/2023 CLINICAL DATA:  Pt reports ongoing lower back pain and spasms for the past two weeks. EXAM: CT LUMBAR SPINE WITHOUT CONTRAST TECHNIQUE: Multidetector CT imaging of the lumbar spine was performed without intravenous contrast administration. Multiplanar CT image reconstructions were also generated. RADIATION DOSE REDUCTION: This exam was performed according to the departmental dose-optimization program which includes automated exposure control, adjustment of the mA and/or kV according to patient size and/or use of iterative reconstruction technique. COMPARISON:  None Available. FINDINGS: Segmentation: 5 lumbar type vertebrae. Alignment: Normal. Vertebrae: No acute fracture or focal pathologic process. Paraspinal and other soft tissues: Negative. Disc levels: Maintained. IMPRESSION: No acute displaced fracture or traumatic listhesis of the lumbar spine. Electronically Signed   By: Morgane  Naveau M.D.   On: 11/13/2023 21:29   CT Renal Stone Study Result Date: 11/13/2023 CLINICAL DATA:  Abdominal/flank pain, stone suspected Pt reports ongoing lower back pain and spasms for the past two weeks. EXAM: CT ABDOMEN AND PELVIS WITHOUT CONTRAST TECHNIQUE: Multidetector CT imaging of the abdomen and pelvis was  performed following the standard protocol without IV contrast. RADIATION DOSE REDUCTION: This exam was performed according to the departmental dose-optimization program which includes automated exposure control, adjustment of the mA and/or kV according to patient size and/or use of iterative reconstruction technique. COMPARISON:  None Available. FINDINGS: Lower chest: No acute abnormality. Hepatobiliary: No focal liver abnormality. No gallstones, gallbladder wall thickening, or pericholecystic fluid. No biliary dilatation. Pancreas: No focal lesion. Normal pancreatic contour. No surrounding inflammatory changes. No main pancreatic ductal dilatation. Spleen: Normal in size without focal abnormality. Adrenals/Urinary Tract: No adrenal nodule bilaterally. No nephrolithiasis and no hydronephrosis. No definite contour-deforming renal mass. No ureterolithiasis or hydroureter. The urinary bladder is unremarkable. Stomach/Bowel: Stomach is within normal limits. No evidence of bowel wall thickening or dilatation. Appendix appears normal. Vascular/Lymphatic: No abdominal aorta or iliac aneurysm. No abdominal, pelvic, or inguinal lymphadenopathy. Reproductive: Uterus and bilateral adnexa are unremarkable. Other: No intraperitoneal free fluid. No intraperitoneal free gas. No organized fluid collection. Musculoskeletal: No abdominal wall hernia or abnormality. No suspicious lytic or blastic osseous lesions. No acute displaced fracture. Please see separately dictated CT lumbar spine 11/13/2023. IMPRESSION: 1. No  acute intra-abdominal or intrapelvic abnormality with limited evaluation on this noncontrast study. 2. Please see separately dictated CT lumbar spine 11/13/2023 Electronically Signed   By: Morgane  Naveau M.D.   On: 11/13/2023 21:28    Scheduled Meds:  gabapentin   100 mg Oral TID   ketorolac   15 mg Intravenous Q6H   lidocaine   2 patch Transdermal Q24H   loratadine   10 mg Oral Daily   Continuous Infusions:    LOS: 1 day   Ivonne Mustache, MD [facility] P1/02/2024, 11:09 AM

## 2023-11-15 NOTE — Plan of Care (Signed)
  Problem: Education: Goal: Knowledge of General Education information will improve Description: Including pain rating scale, medication(s)/side effects and non-pharmacologic comfort measures Outcome: Progressing   Problem: Activity: Goal: Risk for activity intolerance will decrease Outcome: Progressing   Problem: Coping: Goal: Level of anxiety will decrease Outcome: Progressing   Problem: Pain Management: Goal: General experience of comfort will improve Outcome: Progressing   Problem: Safety: Goal: Ability to remain free from injury will improve Outcome: Progressing

## 2023-11-16 DIAGNOSIS — M545 Low back pain, unspecified: Secondary | ICD-10-CM | POA: Diagnosis not present

## 2023-11-16 MED ORDER — SENNA 8.6 MG PO TABS
1.0000 | ORAL_TABLET | Freq: Every day | ORAL | Status: DC
  Administered 2023-11-16 – 2023-11-18 (×3): 8.6 mg via ORAL
  Filled 2023-11-16 (×3): qty 1

## 2023-11-16 MED ORDER — DOCUSATE SODIUM 100 MG PO CAPS
100.0000 mg | ORAL_CAPSULE | Freq: Two times a day (BID) | ORAL | Status: DC
  Administered 2023-11-16 – 2023-11-18 (×5): 100 mg via ORAL
  Filled 2023-11-16 (×5): qty 1

## 2023-11-16 NOTE — Progress Notes (Signed)
  NEUROSURGERY PROGRESS NOTE   No issues overnight. Worked with PT yesterday, able to ambulate with crutches. Still reports fair bit of pain even with log-rolling in bed.  EXAM:  BP 139/75 (BP Location: Left Arm)   Pulse 91   Temp 97.6 F (36.4 C) (Oral)   Resp 18   Ht 5' 6 (1.676 m)   Wt 118.2 kg   LMP 11/14/2023   SpO2 100%   BMI 42.06 kg/m   Awake, alert, oriented  Speech fluent, appropriate  CN grossly intact  5/5 BUE/BLE   IMPRESSION:  38 y.o. female with right L5-S1 disc herniation, neurologically intact. Attempting conservative mgmt  PLAN: - Cont pain regimen - Cont to mobilize with PT, will reassess progress tomorrow and decide about d/c home with continued conservative treatment vs proceeding with microdiscectomy   Gerldine Maizes, MD [facility]

## 2023-11-16 NOTE — Progress Notes (Signed)
 Physical Therapy Treatment Patient Details Name: Valerie Knapp MRN: [id number] DOB: April 20, 1986 Today's Date: 11/16/2023   History of Present Illness 38 y/o who works at [employer] and has to lift heavy boxes, reachign and twisting. She began experiencing some back pain about 3 years ago, however got more pronounced after a few weeks ago when she was bending and twisting to reach something. Since then it has gotten progressively worse with difficult sitting and standing up from the commode without severe back pain once standing. Her pain is on the ight side of he back, into her right glut area, as well as some on the left low back area and some numbness (  right side a lot worse than left). MRI shows L5 -S1 Disc height loss and desiccation. Large right subarticular  disc protrusion with severe right subarticular recess stenosis and  impingement of the descending right S1 nerve roots. Severe canal  stenosis.    PT Comments  Patient seen 2 hours after pain meds and muscle relaxer were given. Reports she is trying not to use the IV meds (as she did yesterday). She was visibly in more pain when mobilizing (rated 9/10 at the worst with side to sit). She was able to progress to stair training in her room with single step x 10 reps with CGA and Rt rail. On return to bed, discussed her need to take pain medication regularly to help control pain enough that she can tolerate mobilizing. If she doesn't feel like she can get up and walk due to severity of pain, she likely is not using pain medicine as much as she needs to. She verbalized understanding. Will attempt to see 1/6 prior to neurosurgery assessment (and after pain medication) to continue to assess tolerance for mobility with ?need for surgery.     If plan is discharge home, recommend the following: Assist for transportation;Help with stairs or ramp for entrance;Assistance with cooking/housework   Can travel by private vehicle        Equipment  Recommendations  None recommended by PT    Recommendations for Other Services       Precautions / Restrictions Precautions Precautions: Back Precaution Booklet Issued: Yes (comment) Precaution Comments: instructed in precautions to minimize pain Required Braces or Orthoses: Spinal Brace Spinal Brace: Other (comment) (abdominal binder) Restrictions Weight Bearing Restrictions Per Provider Order: No     Mobility  Bed Mobility Overal bed mobility: Needs Assistance Bed Mobility: Sidelying to Sit, Rolling, Sit to Sidelying Rolling: Used rails, Modified independent (Device/Increase time) Sidelying to sit: Used rails, Contact guard assist, HOB elevated     Sit to sidelying: Min assist, Used rails General bed mobility comments: initially rolled to left for L side to sit, as feet came over EOB, pt yelling out and went back down onto bed with pain 9/10 on rt side. After resting, attempted out rt side of bed with pt able to do with cues only (HOB slightly elevated and rails). Return to bed from rt side of bed with assist needed to raise legs    Transfers Overall transfer level: Modified independent Equipment used: Crutches                    Ambulation/Gait Ambulation/Gait assistance: Contact guard assist Gait Distance (Feet): 25 Feet (toileted, 25) Assistive device: Crutches Gait Pattern/deviations: Decreased step length - right, Decreased step length - left, Step-to pattern, Step-through pattern   Gait velocity interpretation: <1.8 ft/sec, indicate of risk for recurrent falls  General Gait Details: small steps, 4 point pattern with crutch, crutch, step step. to assist with upright posture and spine offloading. Occasional cue for slightly wider spacing of crutches to improve balance and control. If pt took longer step with RLE, she had incr pain down RLE. Cued to step to crutches instead of step through encouraged a lot of weight bearing in her hands on crutches and this  helped some as well. Good job of not leaning on her axillae   Stairs Stairs: Yes Stairs assistance: Contact guard assist Stair Management: One rail Right, Step to pattern, Sideways Number of Stairs: 10 (1x10 reps) General stair comments: pt educated on sequencing up with good and down with bad and how that should help control pain   Wheelchair Mobility     Tilt Bed    Modified Rankin (Stroke Patients Only)       Balance Overall balance assessment: Needs assistance Sitting-balance support: No upper extremity supported, Feet supported Sitting balance-Leahy Scale: Fair Sitting balance - Comments: >fair NT to minimize back pain/strain   Standing balance support: Bilateral upper extremity supported, During functional activity Standing balance-Leahy Scale: Fair Standing balance comment: using DME to offload spine to decr pain, not for balance                            Cognition Arousal: Alert Behavior During Therapy: WFL for tasks assessed/performed Overall Cognitive Status: Within Functional Limits for tasks assessed                                          Exercises      General Comments General comments (skin integrity, edema, etc.): Lengthy discusssion re: need for use of pain medicine regularly to control pain at manageable level. Pt has been trying to avoid heavier drugs due to fear of addiction (h/o addiction in family member).      Pertinent Vitals/Pain Pain Assessment Pain Assessment: 0-10 Pain Score: 9  Pain Location: back Pain Descriptors / Indicators: Throbbing, Moaning, Sharp, Spasm Pain Intervention(s): Limited activity within patient's tolerance, Monitored during session, Premedicated before session, Repositioned    Home Living                          Prior Function            PT Goals (current goals can now be found in the care plan section) Acute Rehab PT Goals Patient Stated Goal: I want this to be  better so I can continue working and doing my daily things Time For Goal Achievement: 11/28/23 Potential to Achieve Goals: Good Progress towards PT goals: Progressing toward goals    Frequency    Min 1X/week      PT Plan      Co-evaluation              AM-PAC PT 6 Clicks Mobility   Outcome Measure  Help needed turning from your back to your side while in a flat bed without using bedrails?: A Little Help needed moving from lying on your back to sitting on the side of a flat bed without using bedrails?: A Little Help needed moving to and from a bed to a chair (including a wheelchair)?: A Little Help needed standing up from a chair using your arms (e.g., wheelchair or bedside chair)?:  A Little Help needed to walk in hospital room?: A Little Help needed climbing 3-5 steps with a railing? : A Little 6 Click Score: 18    End of Session Equipment Utilized During Treatment: Other (comment) (abdominal binder) Activity Tolerance: Patient limited by pain;Patient tolerated treatment well Patient left: in bed;with call bell/phone within reach Nurse Communication: Mobility status PT Visit Diagnosis: Difficulty in walking, not elsewhere classified (R26.2);Pain Pain - Right/Left: Right Pain - part of body:  (back)     Time: 8970-8894 PT Time Calculation (min) (ACUTE ONLY): 36 min  Charges:    $Gait Training: 23-37 mins PT General Charges $$ ACUTE PT VISIT: 1 Visit                      Macario RAMAN, PT Acute Rehabilitation Services  Office [phone]    Macario SHAUNNA Soja 11/16/2023, 11:18 AM

## 2023-11-16 NOTE — Hospital Course (Signed)
 38 year old female with history of migraine, obesity who presented with severe low back pain. MRI lumbar spine showed significant disc protrusion and severe canal stenosis. Neurosurgery consulted. Currently trying for conservative management before deciding on operative management. PT/OT consulted

## 2023-11-16 NOTE — Progress Notes (Signed)
  Progress Note   Patient: Valerie Knapp FMW:[id number] DOB: [birth year] DOA: 11/13/2023     2 DOS: the patient was seen and examined on 11/16/2023   Brief hospital course: 38 year old female with history of migraine, obesity who presented with severe low back pain. MRI lumbar spine showed significant disc protrusion and severe canal stenosis. Neurosurgery consulted. Currently trying for conservative management before deciding on operative management. PT/OT consulted   Assessment and Plan: Acute low back pain: MRI as above.  Neurosurgery following.  Continuing with conservative management with pain management, supportive care, PT evaluation done, recommended outpatient follow-up.  Per Neurosurgery, cont with mobilization with PT and re-assess tomorrow to decide about d/c home   Leukocytosis: Unclear etiology.  Could be reactive.  Chest x-ray did not show pneumonia.  UA not suspicious for UTI.  Respiratory viral panel negative.  Continue to monitor   Hypokalemia: Normalized   Morbid obesity:BMI 42. we recommend regular exercise and healthy diet.  This problem is most likely contributing to the principal problem   Subjective: Eager to go home soon, still hurting this AM, but is somewhat better  Physical Exam: Vitals:   11/15/23 1517 11/15/23 2257 11/16/23 0738 11/16/23 1217  BP: 121/77 120/72 139/75 (!) 120/58  Pulse: 61  91 75  Resp: 18 17 18 16   Temp: 97.7 F (36.5 C) 97.7 F (36.5 C) 97.6 F (36.4 C) 98.2 F (36.8 C)  TempSrc: Oral Oral Oral Oral  SpO2: 98% 99% 100% 96%  Weight:      Height:       General exam: Awake, laying in bed, in nad Respiratory system: Normal respiratory effort, no wheezing Cardiovascular system: regular rate, s1, s2 Gastrointestinal system: Soft, nondistended, positive BS Central nervous system: CN2-12 grossly intact, strength intact Extremities: Perfused, no clubbing Skin: Normal skin turgor, no notable skin lesions seen Psychiatry: Mood normal // no  visual hallucinations   Data Reviewed:  Labs reviewed: Na 137, K 3.9, Cr 0.82, WBC 16.1, Hgb 11.6  Family Communication: Pt in room, family not at bedside  Disposition: Status is: Inpatient Remains inpatient appropriate because: Severity of illness  Planned Discharge Destination: Home     Author: Garnette Pelt, MD 11/16/2023 3:21 PM  For on call review [email or web address].

## 2023-11-17 DIAGNOSIS — D72829 Elevated white blood cell count, unspecified: Secondary | ICD-10-CM | POA: Diagnosis not present

## 2023-11-17 DIAGNOSIS — E876 Hypokalemia: Secondary | ICD-10-CM

## 2023-11-17 DIAGNOSIS — M5126 Other intervertebral disc displacement, lumbar region: Secondary | ICD-10-CM | POA: Diagnosis present

## 2023-11-17 LAB — COMPREHENSIVE METABOLIC PANEL
ALT: 21 U/L (ref 0–44)
AST: 14 U/L — ABNORMAL LOW (ref 15–41)
Albumin: 3.5 g/dL (ref 3.5–5.0)
Alkaline Phosphatase: 71 U/L (ref 38–126)
Anion gap: 10 (ref 5–15)
BUN: 16 mg/dL (ref 6–20)
CO2: 26 mmol/L (ref 22–32)
Calcium: 9.3 mg/dL (ref 8.9–10.3)
Chloride: 102 mmol/L (ref 98–111)
Creatinine, Ser: 0.85 mg/dL (ref 0.44–1.00)
GFR, Estimated: >60 mL/min (ref >60)
Glucose, Bld: 96 mg/dL (ref 70–99)
Potassium: 4.4 mmol/L (ref 3.5–5.1)
Sodium: 138 mmol/L (ref 135–145)
Total Bilirubin: 0.4 mg/dL (ref 0.0–1.2)
Total Protein: 7.1 g/dL (ref 6.5–8.1)

## 2023-11-17 MED ORDER — ACETAMINOPHEN 500 MG PO TABS
1000.0000 mg | ORAL_TABLET | Freq: Three times a day (TID) | ORAL | Status: DC
  Administered 2023-11-17 – 2023-11-18 (×4): 1000 mg via ORAL
  Filled 2023-11-17 (×4): qty 2

## 2023-11-17 NOTE — Progress Notes (Signed)
 PROGRESS NOTE    Valerie Knapp  FMW:[id number] DOB: 03/23/1986 DOA: 11/13/2023 PCP: Patient, No Pcp Per    Chief Complaint  Patient presents with   Back Pain    Brief Narrative:  38 year old female with history of migraine, obesity who presented with severe low back pain. MRI lumbar spine showed significant disc protrusion and severe canal stenosis. Neurosurgery consulted. Currently trying for conservative management before deciding on operative management. PT/OT consulted    Assessment & Plan:   Principal Problem:   Lumbar disc herniation Active Problems:   Acute low back pain   Hypokalemia   Leukocytosis  Acute low back pain secondary to L5-S1 disc herniation -Patient presenting with severe low back pain.   -MRI of the L-spine done with L5-S1, large right subarticular disc protrusion with impingement of the descending right S1 nerve roots and severe canal stenosis.  -Patient seen in consultation by neurosurgery who recommended conservative management at this time with pain management, supportive care, PT evaluation.   -PT recommending outpatient follow-up.   -Neurosurgery following and recommended continued conservative management with follow-up with PT tomorrow and if continued improvement could follow-up in the outpatient setting closely.   -Per neurosurgery.     Leukocytosis:  Unclear etiology.  -Likely reactive.  -Chest x-ray done negative for any acute infiltrate. -Urinalysis nitrite negative leukocytes -0-5 WBCs.  -Patient remained afebrile.  -Leukocytosis trending down during the hospitalization.  -Respiratory viral panel done was negative.  SARS coronavirus 2 PCR negative.  -Monitor off antibiotics.    Hypokalemia: Repleted during hospitalization. -Outpatient follow-up.   Morbid obesity:BMI 42.  -Lifestyle modification.   -Outpatient follow-up with PCP.    DVT prophylaxis: SCDs Code Status: Full Family Communication: Updated patient.  No family at  bedside. Disposition: Likely home when cleared by neurosurgery.  Status is: Inpatient Remains inpatient appropriate because: Severity of illness   Consultants:  Neurosurgery: Dr. Lanis 11/15/2023  Procedures:  CT renal stone protocol 11/13/2023 Chest x-ray 11/14/2023 MRI L-spine 11/13/2023 CT L-spine 11/13/2023  Antimicrobials:  Anti-infectives (From admission, onward)    None         Subjective: Patient laying in bed.  States did better with rolling walker than crutches today with therapy.  Stated got pain medications prior to therapy with some improvement with back pain currently about a 5/10.  Denies any bowel or bladder incontinence.  Denies any lower extremity numbness or significant weakness.  More with complaints of right lower extremity weakness.    Objective: Vitals:   11/17/23 0329 11/17/23 0851 11/17/23 1222 11/17/23 1612  BP: 134/82 129/62 (!) 151/71 118/68  Pulse: 78 86 88 77  Resp: 16 16 16 18   Temp: 98.2 F (36.8 C) 97.6 F (36.4 C) 98.3 F (36.8 C) 98.1 F (36.7 C)  TempSrc: Oral Oral Oral Oral  SpO2: 98% 98% 99% 95%  Weight:      Height:        Intake/Output Summary (Last 24 hours) at 11/17/2023 1720 Last data filed at 11/17/2023 1400 Gross per 24 hour  Intake 1300 ml  Output --  Net 1300 ml   Filed Weights   11/15/23 0021 11/15/23 0305  Weight: 118.2 kg 118.2 kg    Examination:  General exam: Appears calm and comfortable  Respiratory system: Clear to auscultation. Respiratory effort normal. Cardiovascular system: S1 & S2 heard, RRR. No JVD, murmurs, rubs, gallops or clicks. No pedal edema. Gastrointestinal system: Abdomen is nondistended, soft and nontender. No organomegaly or masses felt. Normal bowel sounds  heard. Central nervous system: Alert and oriented. No focal neurological deficits. Extremities: Symmetric 5 x 5 power. Skin: No rashes, lesions or ulcers Psychiatry: Judgement and insight appear normal. Mood & affect appropriate.      Data Reviewed: I have personally reviewed following labs and imaging studies  CBC: Recent Labs  Lab 11/13/23 1758 11/14/23 1125 11/15/23 0631  WBC 22.5* 17.4* 16.1*  NEUTROABS  --  16.2*  --   HGB 13.4 13.6 11.6*  HCT 40.0 42.6 36.5  MCV 88.1 88.8 89.2  PLT 482* 503* 379    Basic Metabolic Panel: Recent Labs  Lab 11/13/23 1758 11/14/23 1125 11/15/23 0631 11/17/23 0625  NA 137 136 137 138  K 3.1* 3.2* 3.9 4.4  CL 107 105 108 102  CO2 20* 21* 22 26  GLUCOSE 122* 157* 103* 96  BUN 12 9 18 16   CREATININE 0.72 0.64 0.82 0.85  CALCIUM 9.2 9.4 8.9 9.3  MG  --  2.3 2.4  --     GFR: Estimated Creatinine Clearance: 120.9 mL/min (by C-G formula based on SCr of 0.85 mg/dL).  Liver Function Tests: Recent Labs  Lab 11/14/23 1125 11/17/23 0625  AST 26 14*  ALT 36 21  ALKPHOS 90 71  BILITOT 0.6 0.4  PROT 9.5* 7.1  ALBUMIN 4.6 3.5    CBG: No results for input(s): GLUCAP in the last 168 hours.   Recent Results (from the past 240 hours)  SARS Coronavirus 2 by RT PCR (hospital order, performed in [facility] hospital lab) *cepheid single result test* Anterior Nasal Swab     Status: None   Collection Time: 11/14/23 11:20 AM   Specimen: Anterior Nasal Swab  Result Value Ref Range Status   SARS Coronavirus 2 by RT PCR NEGATIVE NEGATIVE Final    Comment: (NOTE) SARS-CoV-2 target nucleic acids are NOT DETECTED.  The SARS-CoV-2 RNA is generally detectable in upper and lower respiratory specimens during the acute phase of infection. The lowest concentration of SARS-CoV-2 viral copies this assay can detect is 250 copies / mL. A negative result does not preclude SARS-CoV-2 infection and should not be used as the sole basis for treatment or other patient management decisions.  A negative result may occur with improper specimen collection / handling, submission of specimen other than nasopharyngeal swab, presence of viral mutation(s) within the areas targeted by this  assay, and inadequate number of viral copies (<250 copies / mL). A negative result must be combined with clinical observations, patient history, and epidemiological information.  Fact Sheet for Patients:   roadlaptop.co.za  Fact Sheet for Healthcare Providers: http://kim-miller.com/  This test is not yet approved or  cleared by the [place]  FDA and has been authorized for detection and/or diagnosis of SARS-CoV-2 by FDA under an Emergency Use Authorization (EUA).  This EUA will remain in effect (meaning this test can be used) for the duration of the COVID-19 declaration under Section 564(b)(1) of the Act, 21 U.S.C. section 360bbb-3(b)(1), unless the authorization is terminated or revoked sooner.  Performed at [facility], [address] [place], [place] [address]   Respiratory (~20 pathogens) panel by PCR     Status: None   Collection Time: 11/14/23 11:20 AM   Specimen: Anterior Nasal Swab; Respiratory  Result Value Ref Range Status   Adenovirus NOT DETECTED NOT DETECTED Final   Coronavirus 229E NOT DETECTED NOT DETECTED Final    Comment: (NOTE) The Coronavirus on the Respiratory Panel, DOES NOT test for the novel  Coronavirus (2019 nCoV)    Coronavirus HKU1 NOT DETECTED NOT DETECTED Final   Coronavirus NL63 NOT DETECTED NOT DETECTED Final   Coronavirus OC43 NOT DETECTED NOT DETECTED Final   Metapneumovirus NOT DETECTED NOT DETECTED Final   Rhinovirus / Enterovirus NOT DETECTED NOT DETECTED Final   Influenza A NOT DETECTED NOT DETECTED Final   Influenza B NOT DETECTED NOT DETECTED Final   Parainfluenza Virus 1 NOT DETECTED NOT DETECTED Final   Parainfluenza Virus 2 NOT DETECTED NOT DETECTED Final   Parainfluenza Virus 3 NOT DETECTED NOT DETECTED Final   Parainfluenza Virus 4 NOT DETECTED NOT DETECTED Final   Respiratory Syncytial Virus NOT DETECTED NOT DETECTED Final   Bordetella pertussis NOT DETECTED NOT  DETECTED Final   Bordetella Parapertussis NOT DETECTED NOT DETECTED Final   Chlamydophila pneumoniae NOT DETECTED NOT DETECTED Final   Mycoplasma pneumoniae NOT DETECTED NOT DETECTED Final    Comment: Performed at [facility], [address], [place], [place] [address]         Radiology Studies: No results found.      Scheduled Meds:  acetaminophen   1,000 mg Oral TID   docusate sodium   100 mg Oral BID   gabapentin   200 mg Oral TID   lidocaine   2 patch Transdermal Q24H   loratadine   10 mg Oral Daily   senna  1 tablet Oral Daily   Continuous Infusions:   LOS: 3 days    Time spent: 35 minutes    Toribio Hummer, MD [facility]   To contact the attending provider between 7A-7P or the covering provider during after hours 7P-7A, please log into the web site [email or web address] and access using universal [place] password for that web site. If you do not have the password, please call the hospital operator.  11/17/2023, 5:20 PM

## 2023-11-17 NOTE — Progress Notes (Signed)
 Physical Therapy Treatment Patient Details Name: Valerie Knapp MRN: [id number] DOB: 1986-08-17 Today's Date: 11/17/2023   History of Present Illness 38 y/o who works at [employer] and has to lift heavy boxes, reachign and twisting. She began experiencing some back pain about 3 years ago, however got more pronounced after a few weeks ago when she was bending and twisting to reach something. Since then it has gotten progressively worse with difficult sitting and standing up from the commode without severe back pain once standing. Her pain is on the ight side of he back, into her right glut area, as well as some on the left low back area and some numbness (  right side a lot worse than left). MRI shows L5 -S1 Disc height loss and desiccation. Large right subarticular  disc protrusion with severe right subarticular recess stenosis and  impingement of the descending right S1 nerve roots. Severe canal  stenosis.    PT Comments  Patient agreed to attempt use of RW and gait improved significantly over use of crutches. Able to smooth gait pattern to more continuous stepping and improve velocity. Pain 6/10 with activity, two hours after pain medication and muscle relaxer. Pt feels this is bearable. She hopes to discharge home today and avoid surgery.     If plan is discharge home, recommend the following: Assist for transportation;Help with stairs or ramp for entrance;Assistance with cooking/housework   Can travel by private vehicle        Equipment Recommendations  Rolling walker (2 wheels)    Recommendations for Other Services       Precautions / Restrictions Precautions Precautions: Back Precaution Booklet Issued: Yes (comment) Precaution Comments: instructed in precautions to minimize pain Required Braces or Orthoses: Spinal Brace Spinal Brace: Other (comment) Spinal Brace Comments: pt preferred no binder today Restrictions Weight Bearing Restrictions Per Provider Order: No      Mobility  Bed Mobility Overal bed mobility: Needs Assistance Bed Mobility: Sidelying to Sit, Rolling, Sit to Sidelying Rolling: Used rails, Modified independent (Device/Increase time) Sidelying to sit: Used rails, Supervision     Sit to sidelying: Used rails, Modified independent (Device/Increase time) General bed mobility comments: exit bed to rt with no physical assist needed (does use rails with HOB flat)    Transfers Overall transfer level: Modified independent Equipment used: Crutches                    Ambulation/Gait Ambulation/Gait assistance: Supervision Gait Distance (Feet): 160 Feet Assistive device: Crutches, Rolling walker (2 wheels) Gait Pattern/deviations: Decreased step length - right, Decreased step length - left, Step-to pattern, Step-through pattern   Gait velocity interpretation: 1.31 - 2.62 ft/sec, indicative of limited community ambulator   General Gait Details: crutches to bathroom; pt then agreed to try RW with improved continuity of gait and velocity; pt preferred RW as her RUE was never caught behind her   Stairs   Stairs assistance: Contact guard assist Stair Management: One rail Right, Step to pattern, Sideways Number of Stairs: 6 General stair comments: pt recalled technique; guarding for safety   Wheelchair Mobility     Tilt Bed    Modified Rankin (Stroke Patients Only)       Balance Overall balance assessment: Needs assistance Sitting-balance support: No upper extremity supported, Feet supported Sitting balance-Leahy Scale: Fair Sitting balance - Comments: >fair NT to minimize back pain/strain   Standing balance support: During functional activity, No upper extremity supported Standing balance-Leahy Scale: Fair Standing balance comment:  able to wash hands at sink without UE support                            Cognition Arousal: Alert Behavior During Therapy: [facility] for tasks assessed/performed Overall  Cognitive Status: Within Functional Limits for tasks assessed                                          Exercises      General Comments General comments (skin integrity, edema, etc.): Educated on positions of comfort (supine and sidelying with pillow to support legs).      Pertinent Vitals/Pain Pain Assessment Pain Assessment: 0-10 Pain Score: 6  Pain Location: back Pain Descriptors / Indicators: Sharp, Spasm Pain Intervention(s): Limited activity within patient's tolerance, Monitored during session, Premedicated before session    Home Living                          Prior Function            PT Goals (current goals can now be found in the care plan section) Acute Rehab PT Goals Patient Stated Goal: I want this to be better so I can continue working and doing my daily things Time For Goal Achievement: 11/28/23 Potential to Achieve Goals: Good Progress towards PT goals: Progressing toward goals    Frequency    Min 1X/week      PT Plan      Co-evaluation              AM-PAC PT 6 Clicks Mobility   Outcome Measure  Help needed turning from your back to your side while in a flat bed without using bedrails?: None Help needed moving from lying on your back to sitting on the side of a flat bed without using bedrails?: None Help needed moving to and from a bed to a chair (including a wheelchair)?: A Little Help needed standing up from a chair using your arms (e.g., wheelchair or bedside chair)?: A Little Help needed to walk in hospital room?: A Little Help needed climbing 3-5 steps with a railing? : A Little 6 Click Score: 20    End of Session Equipment Utilized During Treatment:  (pt preferred no binder today) Activity Tolerance: Patient limited by pain Patient left: in bed;with call bell/phone within reach;with SCD's reapplied Nurse Communication: Mobility status PT Visit Diagnosis: Difficulty in walking, not elsewhere classified  (R26.2);Pain Pain - Right/Left: Right Pain - part of body:  (back)     Time: 9185-9153 PT Time Calculation (min) (ACUTE ONLY): 32 min  Charges:    $Gait Training: 23-37 mins PT General Charges $$ ACUTE PT VISIT: 1 Visit                      Macario RAMAN, PT Acute Rehabilitation Services  Office [phone]    Macario SHAUNNA Soja 11/17/2023, 8:55 AM

## 2023-11-17 NOTE — Progress Notes (Signed)
  NEUROSURGERY PROGRESS NOTE   No issues overnight. Worked with PT, significant improvement with rolling walker vs crutches.  EXAM:  BP 118/68 (BP Location: Left Arm)   Pulse 77   Temp 98.1 F (36.7 C) (Oral)   Resp 18   Ht 5' 6 (1.676 m)   Wt 118.2 kg   LMP 11/14/2023   SpO2 95%   BMI 42.06 kg/m   Awake, alert, oriented  Speech fluent, appropriate  CN grossly intact  5/5 BUE/BLE   IMPRESSION:  38 y.o. female with right L5-S1 disc herniation, neurologically intact. Attempting conservative mgmt  PLAN: - Cont pain regimen - Will plan on d/c home tomorrow after PT with close outpatient NS follow-up   Gerldine Maizes, MD [facility]

## 2023-11-17 NOTE — TOC Transition Note (Signed)
 Transition of Care [facility]) - Discharge Note   Patient Details  Name: Valerie Knapp MRN: [id number] Date of Birth: 1986-04-28  Transition of Care [facility]) CM/SW Contact:  Andrez JULIANNA George, RN Phone Number: 11/17/2023, 1:16 PM   Clinical Narrative:     Pt is discharging home with outpatient therapy arranged by [facility] [employer]. CM has faxed them the information requested.  Pts CM is Recardo Hun through [place]: [email or web address] Case #: [id number]  Walker ordered through [employer] and will be delivered to the room. Pts family is able to provide needed transportation.  No PCP-case management was able to get her an appointment and placed it on the AVS.  Family to transport home.   Final next level of care: OP Rehab Barriers to Discharge: No Barriers Identified   Patient Goals and CMS Choice     Choice offered to / list presented to : Patient      Discharge Placement                       Discharge Plan and Services Additional resources added to the After Visit Summary for                  DME Arranged: Walker rolling DME Agency: [employer] Date DME Agency Contacted: 11/17/23   Representative spoke with at DME Agency: Zack            Social Drivers of Health (SDOH) Interventions SDOH Screenings   Food Insecurity: No Food Insecurity (11/14/2023)  Housing: Low Risk  (11/14/2023)  Transportation Needs: No Transportation Needs (11/14/2023)  Utilities: Not At Risk (11/14/2023)  Tobacco Use: Low Risk  (11/14/2023)     Readmission Risk Interventions     No data to display

## 2023-11-17 NOTE — Plan of Care (Signed)
 No acute changes. Pain mgt for back pain. No surgical intervention for now per neuro.   Problem: Health Behavior/Discharge Planning: Goal: Ability to manage health-related needs will improve Outcome: Not Met (add Reason)   Problem: Activity: Goal: Risk for activity intolerance will decrease Outcome: Not Met (add Reason)   Problem: Coping: Goal: Level of anxiety will decrease Outcome: Not Met (add Reason)   Problem: Pain Management: Goal: General experience of comfort will improve Outcome: Not Met (add Reason)   Problem: Safety: Goal: Ability to remain free from injury will improve Outcome: Not Met (add Reason)

## 2023-11-17 NOTE — Progress Notes (Signed)
 Occupational Therapy Treatment Patient Details Name: Valerie Knapp MRN: [id number] DOB: 25-Aug-1986 Today's Date: 11/17/2023   History of present illness 38 y/o who works at [employer] and has to lift heavy boxes, reachign and twisting. She began experiencing some back pain about 3 years ago, however got more pronounced after a few weeks ago when she was bending and twisting to reach something. Since then it has gotten progressively worse with difficult sitting and standing up from the commode without severe back pain once standing. Her pain is on the ight side of he back, into her right glut area, as well as some on the left low back area and some numbness (  right side a lot worse than left). MRI shows L5 -S1 Disc height loss and desiccation. Large right subarticular  disc protrusion with severe right subarticular recess stenosis and  impingement of the descending right S1 nerve roots. Severe canal  stenosis.   OT comments  Pt making progress with functional goals. Pt reports 3/10 pain at rest and 4/10 with activity. Verbal and visual cues required for correct hand placement during sit - stand transitions and during anterior toileting hygiene standing at toilet, utilizing grab bar for safety. Pt very appreciative of therapy. Pt would continue to benefit from acute OT services to maximize level of function and safety       If plan is discharge home, recommend the following:  A little help with bathing/dressing/bathroom;Assistance with cooking/housework   Equipment Recommendations  BSC/3in1;Tub/shower seat;Other (comment) (ADL A/E kit, toileting aid)    Recommendations for Other Services      Precautions / Restrictions Precautions Precautions: Back Precaution Comments: instructed in precautions to minimize pain, proper body mechanics for toilet transfers from RW level Required Braces or Orthoses: Spinal Brace Spinal Brace: Other (comment) Spinal Brace Comments: pt preferred no binder  today       Mobility Bed Mobility Overal bed mobility: Needs Assistance Bed Mobility: Sidelying to Sit, Rolling, Sit to Sidelying Rolling: Used rails, Modified independent (Device/Increase time) Sidelying to sit: Used rails, Supervision     Sit to sidelying: Used rails, Modified independent (Device/Increase time)      Transfers Overall transfer level: Needs assistance Equipment used: Rolling walker (2 wheels) Transfers: Sit to/from Stand Sit to Stand: Modified independent (Device/Increase time), +2 safety/equipment           General transfer comment: verbal and visual cues required for correct hand placement sit - stand transitions and during anterior toileting hygiene standing at toilet, utilizing grab bar for safety     Balance Overall balance assessment: Needs assistance Sitting-balance support: No upper extremity supported, Feet supported Sitting balance-Leahy Scale: Fair     Standing balance support: During functional activity, No upper extremity supported Standing balance-Leahy Scale: Fair                             ADL either performed or assessed with clinical judgement   ADL Overall ADL's : Needs assistance/impaired     Grooming: Wash/dry hands;Wash/dry face;Supervision/safety;Standing         Lower Body Bathing Details (indicate cue type and reason): reviewed A/E education for bathing tasks       Lower Body Dressing Details (indicate cue type and reason): reviewed A/E education for LB dressing tasks Toilet Transfer: Supervision/safety;Ambulation;Rolling walker (2 wheels);Cueing for safety;Regular Toilet;BSC/3in1;Grab bars   Toileting- Clothing Manipulation and Hygiene: Supervision/safety       Functional mobility during ADLs:  Supervision/safety;Modified independent General ADL Comments: pt educated on body mechanics, reviewed ADL A/E for home use    Extremity/Trunk Assessment Upper Extremity Assessment Upper Extremity Assessment:  Overall WFL for tasks assessed   Lower Extremity Assessment Lower Extremity Assessment: Defer to PT evaluation        Vision Baseline Vision/History: 1 Wears glasses Ability to See in Adequate Light: 0 Adequate Patient Visual Report: No change from baseline     Perception     Praxis      Cognition Arousal: Alert Behavior During Therapy: WFL for tasks assessed/performed Overall Cognitive Status: Within Functional Limits for tasks assessed                                          Exercises      Shoulder Instructions       General Comments      Pertinent Vitals/ Pain       Pain Assessment Pain Assessment: 0-10 Pain Score: 4  Pain Location: back Pain Descriptors / Indicators: Aching, Dull, Discomfort Pain Intervention(s): Limited activity within patient's tolerance, Monitored during session, Premedicated before session, Repositioned  Home Living                                          Prior Functioning/Environment              Frequency  Min 1X/week        Progress Toward Goals  OT Goals(current goals can now be found in the care plan section)  Progress towards OT goals: Progressing toward goals     Plan      Co-evaluation                 AM-PAC OT 6 Clicks Daily Activity     Outcome Measure   Help from another person eating meals?: None Help from another person taking care of personal grooming?: A Little Help from another person toileting, which includes using toliet, bedpan, or urinal?: A Little Help from another person bathing (including washing, rinsing, drying)?: A Little Help from another person to put on and taking off regular upper body clothing?: A Little Help from another person to put on and taking off regular lower body clothing?: A Little 6 Click Score: 19    End of Session Equipment Utilized During Treatment: Rolling walker (2 wheels)  OT Visit Diagnosis: Other abnormalities of  gait and mobility (R26.89);Pain Pain - part of body:  (back)   Activity Tolerance Patient limited by pain   Patient Left in bed;with call bell/phone within reach   Nurse Communication          Time: 8865-8841 OT Time Calculation (min): 24 min  Charges: OT General Charges $OT Visit: 1 Visit OT Treatments $Self Care/Home Management : 8-22 mins $Therapeutic Activity: 8-22 mins    Jacques Karna Loose 11/17/2023, 1:26 PM

## 2023-11-18 DIAGNOSIS — M5126 Other intervertebral disc displacement, lumbar region: Secondary | ICD-10-CM | POA: Diagnosis not present

## 2023-11-18 DIAGNOSIS — D72829 Elevated white blood cell count, unspecified: Secondary | ICD-10-CM | POA: Diagnosis not present

## 2023-11-18 DIAGNOSIS — E876 Hypokalemia: Secondary | ICD-10-CM | POA: Diagnosis not present

## 2023-11-18 LAB — BASIC METABOLIC PANEL
Anion gap: 9 (ref 5–15)
BUN: 9 mg/dL (ref 6–20)
CO2: 26 mmol/L (ref 22–32)
Calcium: 9 mg/dL (ref 8.9–10.3)
Chloride: 102 mmol/L (ref 98–111)
Creatinine, Ser: 0.76 mg/dL (ref 0.44–1.00)
GFR, Estimated: >60 mL/min (ref >60)
Glucose, Bld: 89 mg/dL (ref 70–99)
Potassium: 3.8 mmol/L (ref 3.5–5.1)
Sodium: 137 mmol/L (ref 135–145)

## 2023-11-18 LAB — CBC
HCT: 40 % (ref 36.0–46.0)
Hemoglobin: 12.8 g/dL (ref 12.0–15.0)
MCH: 28.3 pg (ref 26.0–34.0)
MCHC: 32 g/dL (ref 30.0–36.0)
MCV: 88.3 fL (ref 80.0–100.0)
Platelets: 453 10*3/uL — ABNORMAL HIGH (ref 150–400)
RBC: 4.53 MIL/uL (ref 3.87–5.11)
RDW: 13.4 % (ref 11.5–15.5)
WBC: 14.5 10*3/uL — ABNORMAL HIGH (ref 4.0–10.5)
nRBC: 0 % (ref 0.0–0.2)

## 2023-11-18 MED ORDER — GABAPENTIN 100 MG PO CAPS: 200.0000 mg | ORAL_CAPSULE | Freq: Three times a day (TID) | ORAL | 0 refills | Status: AC

## 2023-11-18 MED ORDER — OXYCODONE HCL 5 MG PO TABS: 5.0000 mg | ORAL_TABLET | ORAL | 0 refills | Status: AC | PRN

## 2023-11-18 MED ORDER — POLYETHYLENE GLYCOL 3350 17 G PO PACK: 17.0000 g | PACK | Freq: Every day | ORAL | 0 refills | Status: AC | PRN

## 2023-11-18 MED ORDER — SENNOSIDES-DOCUSATE SODIUM 8.6-50 MG PO TABS: 1.0000 | ORAL_TABLET | Freq: Two times a day (BID) | ORAL | Status: AC

## 2023-11-18 MED ORDER — ACETAMINOPHEN 500 MG PO TABS: 1000.0000 mg | ORAL_TABLET | Freq: Three times a day (TID) | ORAL | Status: AC

## 2023-11-18 NOTE — Progress Notes (Signed)
 Occupational Therapy Treatment Patient Details Name: Valerie Knapp MRN: [id number] DOB: 10-23-1986 Today's Date: 11/18/2023   History of present illness 38 y/o who works at [employer] and has to lift heavy boxes, reachign and twisting. She began experiencing some back pain about 3 years ago, however got more pronounced after a few weeks ago when she was bending and twisting to reach something. Since then it has gotten progressively worse with difficult sitting and standing up from the commode without severe back pain once standing. Her pain is on the ight side of he back, into her right glut area, as well as some on the left low back area and some numbness (  right side a lot worse than left). MRI shows L5 -S1 Disc height loss and desiccation. Large right subarticular  disc protrusion with severe right subarticular recess stenosis and  impingement of the descending right S1 nerve roots. Severe canal  stenosis.   OT comments  Pt making good progress with functional goals. OT assisted pt with showering with Sup using LH bath sponge. Pt able to step in and out of shower with Sup and used 3 in 1 for seating. Pt eager to d/c home later today      If plan is discharge home, recommend the following:  A little help with bathing/dressing/bathroom;Assistance with cooking/housework   Equipment Recommendations  BSC/3in1;Tub/shower seat;Other (comment) (ADL A/E kit, toileting aid)    Recommendations for Other Services      Precautions / Restrictions Precautions Precautions: Back Precaution Booklet Issued: Yes (comment) Precaution Comments: reviewed back precautions to minimize pain, proper body mechanics for shower transfers Required Braces or Orthoses: Spinal Brace Spinal Brace: Other (comment) Spinal Brace Comments: pt preferred no binder today Restrictions Weight Bearing Restrictions Per Provider Order: No       Mobility Bed Mobility               General bed mobility comments: pt  in bathroom with RW upon OT arrival, CNA present    Transfers Overall transfer level: Needs assistance Equipment used: Rolling walker (2 wheels) Transfers: Sit to/from Stand, Bed to chair/wheelchair/BSC Sit to Stand: Modified independent (Device/Increase time), Supervision     Step pivot transfers: Supervision     General transfer comment: Mod I in general, Sup to step in and out of shower using grab bar for safety     Balance Overall balance assessment: Needs assistance Sitting-balance support: No upper extremity supported, Feet supported Sitting balance-Leahy Scale: Fair     Standing balance support: During functional activity, No upper extremity supported Standing balance-Leahy Scale: Fair Standing balance comment: able to step in and out of shower, stand in shiwer perihygiene/bathing                           ADL either performed or assessed with clinical judgement   ADL Overall ADL's : Needs assistance/impaired     Grooming: Wash/dry hands;Wash/dry face;Supervision/safety;Standing   Upper Body Bathing: Modified independent   Lower Body Bathing: Contact guard assist;Supervison/ safety;+2 for safety/equipment Lower Body Bathing Details (indicate cue type and reason): used LH bath sponge Upper Body Dressing : Modified independent       Toilet Transfer: Modified Independent;Ambulation;Rolling walker (2 wheels);BSC/3in1;Grab bars   Toileting- Clothing Manipulation and Hygiene: Modified independent   Tub/ Shower Transfer: Contact guard assist;Supervision/safety;Rolling walker (2 wheels);BSC/3in1;Grab bars   Functional mobility during ADLs: Supervision/safety General ADL Comments: pt participated in showering using 3 in1, grab bars  and LH bath sponge    Extremity/Trunk Assessment              Vision Baseline Vision/History: 1 Wears glasses Ability to See in Adequate Light: 0 Adequate Patient Visual Report: No change from baseline     Perception      Praxis      Cognition Arousal: Alert Behavior During Therapy: WFL for tasks assessed/performed Overall Cognitive Status: Within Functional Limits for tasks assessed                                          Exercises      Shoulder Instructions       General Comments      Pertinent Vitals/ Pain       Pain Assessment Pain Assessment: 0-10 Pain Score: 7  Pain Location: back Pain Descriptors / Indicators: Aching Pain Intervention(s): Monitored during session, Repositioned  Home Living                                          Prior Functioning/Environment              Frequency  Min 1X/week        Progress Toward Goals  OT Goals(current goals can now be found in the care plan section)  Progress towards OT goals: Progressing toward goals     Plan      Co-evaluation                 AM-PAC OT 6 Clicks Daily Activity     Outcome Measure   Help from another person eating meals?: None Help from another person taking care of personal grooming?: None Help from another person toileting, which includes using toliet, bedpan, or urinal?: None Help from another person bathing (including washing, rinsing, drying)?: A Little Help from another person to put on and taking off regular upper body clothing?: A Little Help from another person to put on and taking off regular lower body clothing?: A Little 6 Click Score: 21    End of Session Equipment Utilized During Treatment: Rolling walker (2 wheels);Other (comment) (3 in 1, LH bath sponge)  OT Visit Diagnosis: Other abnormalities of gait and mobility (R26.89);Pain Pain - part of body:  (back)   Activity Tolerance Patient tolerated treatment well   Patient Left in bed;with call bell/phone within reach   Nurse Communication          Time: 8563-8485 OT Time Calculation (min): 38 min  Charges: OT General Charges $OT Visit: 1 Visit OT Treatments $Self Care/Home  Management : 23-37 mins $Therapeutic Activity: 8-22 mins   Jacques Karna Loose 11/18/2023, 3:16 PM

## 2023-11-18 NOTE — Progress Notes (Signed)
 Discharge instructions given. Patient verbalized understanding and all questions were answered.  ?

## 2023-11-18 NOTE — Progress Notes (Signed)
  NEUROSURGERY PROGRESS NOTE   No issues overnight.   EXAM:  BP 122/77 (BP Location: Left Arm)   Pulse 83   Temp 97.7 F (36.5 C) (Oral)   Resp 18   Ht 5' 6 (1.676 m)   Wt 118.2 kg   LMP 11/14/2023   SpO2 98%   BMI 42.06 kg/m   Awake, alert, oriented  Speech fluent, appropriate  CN grossly intact  5/5 BUE/BLE   IMPRESSION:  38 y.o. female with right L5-S1 disc herniation, neurologically intact. Attempting conservative mgmt  PLAN: - Remains stable for d/c home with current pain regimen - F/U in outpatient clinic in 1 week   Gerldine Maizes, MD [facility]

## 2023-11-18 NOTE — Discharge Summary (Signed)
 Physician Discharge Summary  Atiya Knapp FMW:[id number] DOB: 1986/01/08 DOA: 11/13/2023  PCP: Valerie Knapp  Admit date: 11/13/2023 Discharge date: 11/18/2023  Time spent: 55 minutes  Recommendations for Outpatient Follow-up:  Follow-up with Dr. Lanis, neurosurgery in 1 week. Follow-up with Dr. Benjamine planned as scheduled on 11/25/2023.  On follow-up patient need a basic metabolic profile done as well as a magnesium level done to follow-up on electrolytes and renal function.  Patient need a CBC done to follow-up on counts.   Discharge Diagnoses:  Principal Problem:   Lumbar disc herniation Active Problems:   Acute low back pain   Hypokalemia   Leukocytosis   Discharge Condition: Stable and improved.  Diet recommendation: Regular  Filed Weights   11/15/23 0021 11/15/23 0305  Weight: 118.2 kg 118.2 kg    History of present illness:  HPI Knapp Dr.Alekh Gracieann Knapp is a 38 y.o. female with medical history significant of migraines, obesity, and reports previous eye surgeries for retinal detachment and cataracts. She presented to [facility] ED yesterday after having difficulty standing and getting off of the toilet yesterday. 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 Approximately two weeks ago she was reaching for a sales tag and twisted and noted increased lower back pain/spasms. She reports she normally is able to stretch and use tiger balm and her lower back pain is back to baseline in a few days. This time she reports the pain has gotten progressively worse over the past two weeks and she was no longer able to tolerate the pain when she describes her back locked up when attempting to stand from the toilet yesterday. She reports 9-10/10 lower back throbbing that intermittently shoots down both legs. When the pain shoots down her legs she describes it as sharp. She was  taking Advil at home without relief. In the ED she reports pain relief to 2-3/10 with IV Dilaudid  that is short lived. She denies saddle anesthesia or bowel/bladder incontinence.  During interview Valerie Knapp also reports a non-productive cough and chest congestion that she attributes to post nasal drip. She denies any known sick contacts. Denies fever or chills.    ED Course: On arrival to [facility] ED patient was noted to be afebrile temp 36.6C, BP 174/99, HR 126, RR 18, SpO2 99% on room air and reporting 10/10 lower back pain.  CT L-spine obtained and shows No acute fracture or traumatic listhesis of the lumbar spine.  MRI of the L-spine obtained and shows L5-S1 large right subarticular disc protrusion with impingement of the descending right S1 nerve roots and severe canal stenosis.  A CT renal stone was also obtained and negative for any kidney stones. Labs notable for WBC 22.5, platelets 482, potassium of 3.1, glucose elevated at 122, and urinalysis without signs of UTI.  In the ED she received Tylenol , dexamethasone , fentanyl , Norco, Dilaudid , Toradol , Robaxin , morphine , Zofran , and 40 mEq of potassium.  After multiple trials of pain management patient was able to ambulate but still with significant pain. [facility] contacted for admission and pain management.  Hospital Course:  Acute low back pain secondary to L5-S1 disc herniation -Patient presented with severe low back pain.   -MRI of the L-spine done with L5-S1, large right subarticular disc protrusion with impingement of the descending right S1 nerve roots and severe canal stenosis.  -Patient seen in consultation by neurosurgery who  recommended conservative management at this time with pain management, supportive care, PT evaluation.   -PT recommended outpatient follow-up.   -Neurosurgery saw the patient during the hospitalization and recommended continued conservative management with follow-up with PT. -Patient improved clinically during the  hospitalization, and was cleared for discharge by neurosurgery with continued current pain regimen as patient was receiving in the hospital with close outpatient follow-up in 1 week.  -Patient will be discharged in stable and improved condition.     Leukocytosis:  Unclear etiology.  -Likely reactive.  -Chest x-ray done negative for any acute infiltrate. -Urinalysis nitrite negative leukocytes -0-5 WBCs.  -Patient remained afebrile.  -Leukocytosis trended down during the hospitalization.  -Respiratory viral panel done was negative.  SARS coronavirus 2 PCR negative.  -Patient remained stable. -Outpatient follow-up.   Hypokalemia: Repleted during hospitalization. -Outpatient follow-up.   Morbid obesity:BMI 42.  -Lifestyle modification.   -Outpatient follow-up with PCP.   Procedures: CT renal stone protocol 11/13/2023 Chest x-ray 11/14/2023 MRI L-spine 11/13/2023 CT L-spine 11/13/2023  Consultations: Neurosurgery: Dr. Lanis 11/15/2023   Discharge Exam: Vitals:   11/18/23 1151 11/18/23 1521  BP: (!) 110/55 130/77  Pulse: 82 100  Resp: 18 18  Temp: 98.3 F (36.8 C) 98 F (36.7 C)  SpO2: 95% 100%    General: NAD Cardiovascular: RRR no murmurs rubs or gallops.  No JVD.  No lower extremity edema. Respiratory: Clear to auscultation bilaterally.  No wheezes, no crackles, no rhonchi.  Fair air movement.  Speaking in full sentences.  Discharge Instructions   Discharge Instructions     Ambulatory referral to Physical Therapy   Complete by: As directed    Diet general   Complete by: As directed    Increase activity slowly   Complete by: As directed       Allergies as of 11/18/2023       Reactions   Benadryl [diphenhydramine Hcl (sleep)] Swelling        Medication List     TAKE these medications    acetaminophen  500 MG tablet Commonly known as: TYLENOL  Take 2 tablets (1,000 mg total) by mouth 3 (three) times daily for 10 days.   cetirizine 10 MG tablet Commonly  known as: ZYRTEC Take 10 mg by mouth daily.   gabapentin  100 MG capsule Commonly known as: NEURONTIN  Take 2 capsules (200 mg total) by mouth 3 (three) times daily.   ibuprofen 200 MG tablet Commonly known as: ADVIL Take 200 mg by mouth every 4 (four) hours as needed for moderate pain (pain score 4-6).   lidocaine  5 % Commonly known as: Lidoderm  Place 1 patch onto the skin daily. Remove & Discard patch within 12 hours or as directed by MD   methocarbamol  500 MG tablet Commonly known as: ROBAXIN  Take 1 tablet (500 mg total) by mouth 2 (two) times daily.   nortriptyline  10 MG capsule Commonly known as: PAMELOR  One po qhs xone week, then 2 tabs po qhs   oxyCODONE  5 MG immediate release tablet Commonly known as: Oxy IR/ROXICODONE  Take 1 tablet (5 mg total) by mouth every 4 (four) hours as needed for moderate pain (pain score 4-6).   polyethylene glycol 17 g packet Commonly known as: MIRALAX  / GLYCOLAX  Take 17 g by mouth daily as needed for mild constipation.   senna-docusate 8.6-50 MG tablet Commonly known as: Senokot-S Take 1 tablet by mouth 2 (two) times daily.   SUMAtriptan  100 MG tablet Commonly known as: IMITREX  Take 1 tablet (100 mg total) by  mouth once as needed for migraine. May repeat in 2 hours if headache persists or recurs.               Durable Medical Equipment  (From admission, onward)           Start     Ordered   11/17/23 1218  For home use only DME Walker rolling  Once       Question Answer Comment  Walker: With 5 Inch Wheels   Patient needs a walker to treat with the following condition Weakness      11/17/23 1217           Allergies  Allergen Reactions   Benadryl [Diphenhydramine Hcl (Sleep)] Swelling    Follow-up Information     Benjamine Aland, MD Follow up on 11/25/2023.   Specialty: Family Medicine Why: Your appointment is at 10:45 am. If you need to reschedule please call the office.  arrive 30 min early.  please wear a face  mask, bring:  a photo ID, insurance card and medications in bottles Contact information: [address] [place] [address] [phone]         Lanis Pupa, MD. Schedule an appointment as soon as possible for a visit in 1 week(s).   Specialty: Neurosurgery Contact information: [address] [place] [address] [phone]                  The results of significant diagnostics from this hospitalization (including imaging, microbiology, ancillary and laboratory) are listed below for reference.    Significant Diagnostic Studies: DG CHEST PORT 1 VIEW Result Date: 11/14/2023 CLINICAL DATA:  Chest congestion.  Recent low back injury. EXAM: PORTABLE CHEST 1 VIEW COMPARISON:  None Available. FINDINGS: Apical lordotic positioning. Midline trachea.  Normal heart size and mediastinal contours. Sharp costophrenic angles.  No pneumothorax.  Clear lungs. IMPRESSION: No active disease. Electronically Signed   By: Rockey Kilts M.D.   On: 11/14/2023 09:59   MR Lumbar Spine W Wo Contrast Result Date: 11/14/2023 CLINICAL DATA:  Low back pain, cauda equina syndrome suspected EXAM: MRI LUMBAR SPINE WITHOUT AND WITH CONTRAST TECHNIQUE: Multiplanar and multiecho pulse sequences of the lumbar spine were obtained without and with intravenous contrast. CONTRAST:  10mL GADAVIST  GADOBUTROL  1 MMOL/ML IV SOLN COMPARISON:  None Available. FINDINGS: Segmentation:  Standard. Alignment:  No substantial sagittal subluxation. Vertebrae: Degenerative/discogenic endplate signal changes at the L5-S1 level. Conus medullaris and cauda equina: Conus extends to the T12-L1 level. Conus appears normal. Paraspinal and other soft tissues: Unremarkable. Disc levels: T12-L1: No significant disc protrusion, foraminal stenosis, or canal stenosis. L1-L2: No significant disc protrusion, foraminal stenosis, or canal stenosis. L2-L3: No significant disc protrusion, foraminal stenosis, or canal stenosis.  L3-L4: No significant disc protrusion, foraminal stenosis, or canal stenosis. L4-L5: Facet arthropathy.  No significant stenosis. L5-S1: Disc height loss and desiccation. Large right subarticular disc protrusion with severe right subarticular recess stenosis and impingement of the descending right S1 nerve roots. Severe canal stenosis. Patent foramina. IMPRESSION: At L5-S1, large right subarticular disc protrusion with impingement of the descending right S1 nerve roots and severe canal stenosis. Electronically Signed   By: Gilmore GORMAN Molt M.D.   On: 11/14/2023 00:35   CT L-SPINE NO CHARGE Result Date: 11/13/2023 CLINICAL DATA:  Pt reports ongoing lower back pain and spasms for the past two weeks. EXAM: CT LUMBAR SPINE WITHOUT CONTRAST TECHNIQUE: Multidetector CT imaging of the lumbar spine was performed without intravenous  contrast administration. Multiplanar CT image reconstructions were also generated. RADIATION DOSE REDUCTION: This exam was performed according to the departmental dose-optimization program which includes automated exposure control, adjustment of the mA and/or kV according to patient size and/or use of iterative reconstruction technique. COMPARISON:  None Available. FINDINGS: Segmentation: 5 lumbar type vertebrae. Alignment: Normal. Vertebrae: No acute fracture or focal pathologic process. Paraspinal and other soft tissues: Negative. Disc levels: Maintained. IMPRESSION: No acute displaced fracture or traumatic listhesis of the lumbar spine. Electronically Signed   By: Morgane  Naveau M.D.   On: 11/13/2023 21:29   CT Renal Stone Study Result Date: 11/13/2023 CLINICAL DATA:  Abdominal/flank pain, stone suspected Pt reports ongoing lower back pain and spasms for the past two weeks. EXAM: CT ABDOMEN AND PELVIS WITHOUT CONTRAST TECHNIQUE: Multidetector CT imaging of the abdomen and pelvis was performed following the standard protocol without IV contrast. RADIATION DOSE REDUCTION: This exam was  performed according to the departmental dose-optimization program which includes automated exposure control, adjustment of the mA and/or kV according to patient size and/or use of iterative reconstruction technique. COMPARISON:  None Available. FINDINGS: Lower chest: No acute abnormality. Hepatobiliary: No focal liver abnormality. No gallstones, gallbladder wall thickening, or pericholecystic fluid. No biliary dilatation. Pancreas: No focal lesion. Normal pancreatic contour. No surrounding inflammatory changes. No main pancreatic ductal dilatation. Spleen: Normal in size without focal abnormality. Adrenals/Urinary Tract: No adrenal nodule bilaterally. No nephrolithiasis and no hydronephrosis. No definite contour-deforming renal mass. No ureterolithiasis or hydroureter. The urinary bladder is unremarkable. Stomach/Bowel: Stomach is within normal limits. No evidence of bowel wall thickening or dilatation. Appendix appears normal. Vascular/Lymphatic: No abdominal aorta or iliac aneurysm. No abdominal, pelvic, or inguinal lymphadenopathy. Reproductive: Uterus and bilateral adnexa are unremarkable. Other: No intraperitoneal free fluid. No intraperitoneal free gas. No organized fluid collection. Musculoskeletal: No abdominal wall hernia or abnormality. No suspicious lytic or blastic osseous lesions. No acute displaced fracture. Please see separately dictated CT lumbar spine 11/13/2023. IMPRESSION: 1. No acute intra-abdominal or intrapelvic abnormality with limited evaluation on this noncontrast study. 2. Please see separately dictated CT lumbar spine 11/13/2023 Electronically Signed   By: Morgane  Naveau M.D.   On: 11/13/2023 21:28    Microbiology: Recent Results (from the past 240 hours)  SARS Coronavirus 2 by RT PCR (hospital order, performed in [facility] hospital lab) *cepheid single result test* Anterior Nasal Swab     Status: None   Collection Time: 11/14/23 11:20 AM   Specimen: Anterior Nasal Swab  Result  Value Ref Range Status   SARS Coronavirus 2 by RT PCR NEGATIVE NEGATIVE Final    Comment: (NOTE) SARS-CoV-2 target nucleic acids are NOT DETECTED.  The SARS-CoV-2 RNA is generally detectable in upper and lower respiratory specimens during the acute phase of infection. The lowest concentration of SARS-CoV-2 viral copies this assay can detect is 250 copies / mL. A negative result does not preclude SARS-CoV-2 infection and should not be used as the sole basis for treatment or other patient management decisions.  A negative result may occur with improper specimen collection / handling, submission of specimen other than nasopharyngeal swab, presence of viral mutation(s) within the areas targeted by this assay, and inadequate number of viral copies (<250 copies / mL). A negative result must be combined with clinical observations, patient history, and epidemiological information.  Fact Sheet for Patients:   roadlaptop.co.za  Fact Sheet for Healthcare Providers: http://kim-miller.com/  This test is not yet approved or  cleared by the [place]  FDA  and has been authorized for detection and/or diagnosis of SARS-CoV-2 by FDA under an Emergency Use Authorization (EUA).  This EUA will remain in effect (meaning this test can be used) for the duration of the COVID-19 declaration under Section 564(b)(1) of the Act, 21 U.S.C. section 360bbb-3(b)(1), unless the authorization is terminated or revoked sooner.  Performed at [facility], [address] [place], [place] [address]   Respiratory (~20 pathogens) panel by PCR     Status: None   Collection Time: 11/14/23 11:20 AM   Specimen: Anterior Nasal Swab; Respiratory  Result Value Ref Range Status   Adenovirus NOT DETECTED NOT DETECTED Final   Coronavirus 229E NOT DETECTED NOT DETECTED Final    Comment: (NOTE) The Coronavirus on the Respiratory Panel, DOES NOT test for the novel   Coronavirus (2019 nCoV)    Coronavirus HKU1 NOT DETECTED NOT DETECTED Final   Coronavirus NL63 NOT DETECTED NOT DETECTED Final   Coronavirus OC43 NOT DETECTED NOT DETECTED Final   Metapneumovirus NOT DETECTED NOT DETECTED Final   Rhinovirus / Enterovirus NOT DETECTED NOT DETECTED Final   Influenza A NOT DETECTED NOT DETECTED Final   Influenza B NOT DETECTED NOT DETECTED Final   Parainfluenza Virus 1 NOT DETECTED NOT DETECTED Final   Parainfluenza Virus 2 NOT DETECTED NOT DETECTED Final   Parainfluenza Virus 3 NOT DETECTED NOT DETECTED Final   Parainfluenza Virus 4 NOT DETECTED NOT DETECTED Final   Respiratory Syncytial Virus NOT DETECTED NOT DETECTED Final   Bordetella pertussis NOT DETECTED NOT DETECTED Final   Bordetella Parapertussis NOT DETECTED NOT DETECTED Final   Chlamydophila pneumoniae NOT DETECTED NOT DETECTED Final   Mycoplasma pneumoniae NOT DETECTED NOT DETECTED Final    Comment: Performed at [facility], [address], [place], [place] [address]     Labs: Basic Metabolic Panel: Recent Labs  Lab 11/13/23 1758 11/14/23 1125 11/15/23 0631 11/17/23 0625 11/18/23 0523  NA 137 136 137 138 137  K 3.1* 3.2* 3.9 4.4 3.8  CL 107 105 108 102 102  CO2 20* 21* 22 26 26   GLUCOSE 122* 157* 103* 96 89  BUN 12 9 18 16 9   CREATININE 0.72 0.64 0.82 0.85 0.76  CALCIUM 9.2 9.4 8.9 9.3 9.0  MG  --  2.3 2.4  --   --    Liver Function Tests: Recent Labs  Lab 11/14/23 1125 11/17/23 0625  AST 26 14*  ALT 36 21  ALKPHOS 90 71  BILITOT 0.6 0.4  PROT 9.5* 7.1  ALBUMIN 4.6 3.5   No results for input(s): LIPASE, AMYLASE in the last 168 hours. No results for input(s): AMMONIA in the last 168 hours. CBC: Recent Labs  Lab 11/13/23 1758 11/14/23 1125 11/15/23 0631 11/18/23 0523  WBC 22.5* 17.4* 16.1* 14.5*  NEUTROABS  --  16.2*  --   --   HGB 13.4 13.6 11.6* 12.8  HCT 40.0 42.6 36.5 40.0  MCV 88.1 88.8 89.2 88.3  PLT 482* 503* 379 453*   Cardiac  Enzymes: No results for input(s): CKTOTAL, CKMB, CKMBINDEX, TROPONINI in the last 168 hours. BNP: BNP (last 3 results) No results for input(s): BNP in the last 8760 hours.  ProBNP (last 3 results) No results for input(s): PROBNP in the last 8760 hours.  CBG: No results for input(s): GLUCAP in the last 168 hours.     Signed:  Toribio Hummer MD.  [facility] 11/18/2023, 3:31 PM

## 2023-11-18 NOTE — Plan of Care (Signed)
 Pain management given. Calm, cooperative throughout the day.   Problem: Education: Goal: Knowledge of General Education information will improve Description: Including pain rating scale, medication(s)/side effects and non-pharmacologic comfort measures Outcome: Progressing   Problem: Activity: Goal: Risk for activity intolerance will decrease Outcome: Progressing   Problem: Safety: Goal: Ability to remain free from injury will improve Outcome: Progressing   Problem: Pain Management: Goal: General experience of comfort will improve Outcome: Progressing

## 2023-12-09 NOTE — Therapy (Signed)
OUTPATIENT PHYSICAL THERAPY THORACOLUMBAR EVALUATION   Patient Name: Valerie Knapp MRN: [id number] DOB:08-30-1986, 38 y.o., female Today's Date: 12/10/2023  END OF SESSION:  PT End of Session - 12/10/23 0808     Visit Number 1             Past Medical History:  Diagnosis Date   Migraine    Past Surgical History:  Procedure Laterality Date   NO PAST SURGERIES     SCLERAL BUCKLE WITH CRYO Right 10/20/2015   Procedure: SCLERAL BUCKLE WITH CRYO, EXTERNAL DRAIN;  Surgeon: Carmela Rima, MD;  Location: [facility] OR;  Service: Ophthalmology;  Laterality: Right;   Patient Active Problem List   Diagnosis Date Noted   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 Pt states she was reaching and twisting and felt a pop on the R low back. She continued to work with the pain progressively getting worse until 11/13/23, when the pain became too great. At that time she was not able to get up from sitting with her back locking up, and she sought treatment at an ED. Currently, she reports R low back with radiating pain to her hip most of the time, but sometimes extending to the foot. Pt endorses N/T of the posterior R leg, and that at times it feel like R leg will give out. Pt notes the low bak pain has improved since 11/13/23  PERTINENT HISTORY:   High BMI  PAIN:  Are you having pain? Yes: NPRS scale: 3/10. Pain range the week prio to starting PT: 2-4/10 Pain location: R low back and LE Pain description: ache, throb Aggravating factors: Transitions sit to/from stand, lying on stomach, bending forward, sitting c good posture Relieving factors: Ibuprofen, lying on R side or back, hot and cold packs   PRECAUTIONS: None  RED FLAGS: None   WEIGHT BEARING RESTRICTIONS: No  FALLS:  Has patient fallen in last 6 months? 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 Hpes to return to work by March   OBJECTIVE:  Note: Objective measures were completed at Evaluation unless otherwise noted.  DIAGNOSTIC FINDINGS:  MR lumbar spine 11/13/23 IMPRESSION: At L5-S1, large right subarticular disc protrusion with impingement of the descending right S1 nerve roots and severe canal stenosis.  PATIENT SURVEYS:  Modified Oswestry 42%   COGNITION: Overall cognitive status: Within functional limits for tasks assessed     SENSATION: WFL  MUSCLE LENGTH: Hamstrings: Right limited deg; Left NT deg [name] test: Right NT deg; Left NT deg  POSTURE: rounded shoulders, forward head, decreased lumbar lordosis, and decreased thoracic kyphosis  PALPATION: Not TTP of the low back, gluteal, or hip regions  LUMBAR ROM:   AROM eval  Flexion Markedly limited- mid thigh; pulling p  Extension Min limited; p  Right lateral flexion Full; no p  Left lateral flexion Full; pulling pain  Right rotation Full; min p  Left rotation Full; min p  P= cocordant pain  (Blank rows = not tested)  LOWER EXTREMITY ROM:     WNLs for functional movements Active  Right eval Left eval  Hip flexion    Hip extension    Hip  abduction    Hip adduction    Hip internal rotation    Hip external rotation    Knee flexion    Knee extension    Ankle dorsiflexion    Ankle plantarflexion    Ankle inversion    Ankle eversion     (Blank rows = not tested)  LOWER EXTREMITY MMT:    Myotome screen negative. Weak core. MMT Right eval Left eval  Hip flexion 5 5  Hip extension    Hip abduction    Hip adduction    Hip internal rotation    Hip external rotation    Knee flexion 5 5  Knee extension 5 5  Ankle dorsiflexion 5 5  Ankle plantarflexion 5 5  Ankle inversion    Ankle eversion     (Blank rows = not tested)  LUMBAR SPECIAL TESTS:  Straight leg raise test: Positive and Slump test: Positive  FUNCTIONAL TESTS:  NT  GAIT: Distance walked: 200' Assistive device utilized: None Level of assistance: Complete Independence Comments: WNLs  TREATMENT DATE:  [facility] Adult PT Treatment:                                                DATE: 12/10/23 Therapeutic Exercise: Developed, instructed in, and pt completed therex as noted in HEP  PATIENT EDUCATION:  Education details: Eval findings, POC, HEP, self care Person educated: Patient Education method: Explanation, Demonstration, Tactile cues, Verbal cues, and Handouts Education comprehension: verbalized understanding, returned demonstration, verbal cues required, and tactile cues required  HOME EXERCISE PROGRAM: Access Code: [email or web address] URL: [email or web address] Date: 12/10/2023 Prepared by: Joellyn Rued  Exercises - Standing Lumbar Extension  - 3-6 x daily - 7 x weekly - 1 sets - 10 reps - 2 hold - Supine Piriformis Stretch with Foot on Ground  - 3 x daily - 7 x weekly - 1 sets - 3-5 reps - 30 hold - Seated Sciatic Tensioner  - 2 x daily - 7 x weekly - 1 sets - 10 reps - 1 hold  ASSESSMENT:  CLINICAL IMPRESSION: Patient is  a 38 y.o. female who was seen today for physical therapy evaluation and treatment for M51.26 (ICD-10-CM) - Herniated nucleus pulposus, lumbar.  Pt presents to PT with improving R low back and R LE pain, but with signs and symptoms consistent with referring Dx. R SLR and slump tests are positive and forward trunk flexion is significantly limited with marked provocation of concordant pain. Myotome screen was negative. Pt was started on a HEP. Pt will benefit from skilled PT to address impairments to optimize back function with less pain.   OBJECTIVE IMPAIRMENTS: decreased activity tolerance, decreased ROM, decreased strength, postural dysfunction, and obesity.   ACTIVITY LIMITATIONS: carrying, lifting, bending, sitting, squatting, and bed mobility  PARTICIPATION LIMITATIONS: meal prep, cleaning, laundry, driving, shopping, community activity, and occupation  PERSONAL FACTORS: Fitness, Time since onset of injury/illness/exacerbation, and 1 comorbidity: high BMI  are also affecting patient's functional outcome.   REHAB POTENTIAL: Good  CLINICAL DECISION MAKING: Evolving/moderate complexity  EVALUATION COMPLEXITY: Moderate   GOALS:  SHORT TERM GOALS=LTGs   LONG TERM GOALS: Target date: 01/30/24  Pt will be Ind in a final HEP to maintain achieved LOF  Baseline: started Goal status: INITIAL  2.  Pt will voice understanding of measures to assist in pain reduction  Baseline: started Goal status: INITIAL  3.  Increase forward bending to to min limited for improved function and indication of decreased pain Baseline: markedly limited to top of kness Goal status: INITIAL  4.  Pt will report 50% or greater decrease in R low back and LE pain for improved function and QOL Baseline: 4/10 Goal status: INITIAL  5.  Pt's Mod Oswestry score will improved to the predicted value of 22% as indication of improved function  Baseline: 44% Goal status: INITIAL  6.  Pt will demonstrate proper body  mechanics to assist with pain management and return to work Baseline: not started Goal status: INITIAL  PLAN:  PT FREQUENCY: 2x/week  PT DURATION: 6 weeks  PLANNED INTERVENTIONS: 97164- PT Re-evaluation, 97110-Therapeutic exercises, 97530- Therapeutic activity, 97535- Self Care, 19147- Manual therapy, 97014- Electrical stimulation (unattended), [id number]- Electrical stimulation (manual), [id number]- Traction (mechanical), Patient/Family education, Taping, Dry Needling, Joint mobilization, Spinal mobilization, Cryotherapy, and Moist heat.  PLAN FOR NEXT SESSION: Assess response to HEP; progress therex as indicated; use of modalities, manual therapy; and TPDN as indicated.  Thaddeaus Monica MS, PT 12/11/23 3:44 PM

## 2023-12-10 ENCOUNTER — Ambulatory Visit: Payer: Managed Care, Other (non HMO) | Attending: Neurosurgery

## 2023-12-10 ENCOUNTER — Other Ambulatory Visit: Payer: Self-pay

## 2023-12-10 DIAGNOSIS — M6281 Muscle weakness (generalized): Secondary | ICD-10-CM | POA: Diagnosis present

## 2023-12-10 DIAGNOSIS — M5441 Lumbago with sciatica, right side: Secondary | ICD-10-CM | POA: Diagnosis present

## 2023-12-10 DIAGNOSIS — G8929 Other chronic pain: Secondary | ICD-10-CM | POA: Insufficient documentation

## 2023-12-15 ENCOUNTER — Ambulatory Visit: Payer: Managed Care, Other (non HMO) | Attending: Neurosurgery | Admitting: Physical Therapy

## 2023-12-15 DIAGNOSIS — M6281 Muscle weakness (generalized): Secondary | ICD-10-CM | POA: Insufficient documentation

## 2023-12-15 DIAGNOSIS — G8929 Other chronic pain: Secondary | ICD-10-CM | POA: Diagnosis present

## 2023-12-15 DIAGNOSIS — M5441 Lumbago with sciatica, right side: Secondary | ICD-10-CM | POA: Insufficient documentation

## 2023-12-15 NOTE — Therapy (Signed)
OUTPATIENT PHYSICAL THERAPY THORACOLUMBAR EVALUATION   Patient Name: Valerie Knapp MRN: [id number] DOB:Nov 18, 1985, 38 y.o., female Today's Date: 12/15/2023  END OF SESSION:  PT End of Session - 12/15/23 0759     Visit Number 2    Number of Visits 13    Date for PT Re-Evaluation 01/30/24    Authorization Type [employer] MANAGED    PT Start Time 0800    PT Stop Time 0845    PT Time Calculation (min) 45 min              Past Medical History:  Diagnosis Date   Migraine    Past Surgical History:  Procedure Laterality Date   NO PAST SURGERIES     SCLERAL BUCKLE WITH CRYO Right 10/20/2015   Procedure: SCLERAL BUCKLE WITH CRYO, EXTERNAL DRAIN;  Surgeon: Carmela Rima, MD;  Location: [facility] OR;  Service: Ophthalmology;  Laterality: Right;   Patient Active Problem List   Diagnosis Date Noted   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   Leukocytosis 11/17/2023   Acute low back pain 11/14/2023   Hypokalemia 11/14/2023   Chronic migraine 10/19/2015    PCP: Patient, No Pcp Per   REFERRING PROVIDER: Lisbeth Renshaw, MD   REFERRING DIAG: M51.26 (ICD-10-CM) - Herniated nucleus pulposus, lumbar   Rationale for Evaluation and Treatment: Rehabilitation  THERAPY DIAG:  Chronic right-sided low back pain with right-sided sciatica  Muscle weakness (generalized)  ONSET DATE: 10/21/23  SUBJECTIVE:                                                                                                                                                                                           SUBJECTIVE STATEMENT:  The numb feeling is better.  Its a dull ache.  Its not down my leg it is in my buttock. Rated 2/10. The leg does not feel weak. She is not using her crutches or walker at all anymore.    PERTINENT HISTORY:  High BMI  PAIN:  Are you having pain? Yes: NPRS scale: 2/10. Pain range the week prio to starting PT: 2-4/10 Pain location: R low back and LE Pain description: ache,  throb Aggravating factors: Transitions sit to/from stand, lying on stomach, bending forward, sitting c good posture Relieving factors: Ibuprofen, lying on R side or back, hot and cold packs   PRECAUTIONS: None  RED FLAGS: None   WEIGHT BEARING RESTRICTIONS: No  FALLS:  Has patient fallen in last 6 months? 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 Hpes to return to work by March   OBJECTIVE:  Note: Objective measures were completed at Evaluation unless otherwise noted.  DIAGNOSTIC FINDINGS:  MR lumbar spine 11/13/23 IMPRESSION: At L5-S1, large right subarticular disc protrusion with impingement of the descending right S1 nerve roots and severe canal stenosis.  PATIENT SURVEYS:  Modified Oswestry 42%   COGNITION: Overall cognitive status: Within functional limits for tasks assessed     SENSATION: WFL  MUSCLE LENGTH: Hamstrings: Right limited deg; Left NT deg [name] test: Right NT deg; Left NT deg  POSTURE: rounded shoulders, forward head, decreased lumbar lordosis, and decreased thoracic kyphosis  PALPATION: Not TTP of the low back, gluteal, or hip regions  LUMBAR ROM:   AROM eval  Flexion Markedly limited- mid thigh; pulling p  Extension Min limited; p  Right lateral flexion Full; no p  Left lateral flexion Full; pulling pain  Right rotation Full; min p  Left rotation Full; min p  P= cocordant pain  (Blank rows = not tested)  LOWER EXTREMITY ROM:     WNLs for functional movements Active  Right eval Left eval  Hip flexion    Hip extension    Hip abduction    Hip adduction    Hip internal rotation    Hip external rotation    Knee flexion    Knee extension    Ankle dorsiflexion    Ankle plantarflexion    Ankle  inversion    Ankle eversion     (Blank rows = not tested)  LOWER EXTREMITY MMT:    Myotome screen negative. Weak core. MMT Right eval Left eval  Hip flexion 5 5  Hip extension    Hip abduction    Hip adduction    Hip internal rotation    Hip external rotation    Knee flexion 5 5  Knee extension 5 5  Ankle dorsiflexion 5 5  Ankle plantarflexion 5 5  Ankle inversion    Ankle eversion     (Blank rows = not tested)  LUMBAR SPECIAL TESTS:  Straight leg raise test: Positive and Slump test: Positive  FUNCTIONAL TESTS:  NT  GAIT: Distance walked: 200' Assistive device utilized: None Level of assistance: Complete Independence Comments: WNLs  TREATMENT DATE:   [facility] Adult PT Treatment:                                                DATE: 12/15/23 Supine LTR small ROM  Supine piriformis  Sciatic nerve flossing thigh supported by towel  Leg lengthener x 5  Core Stability: pain with A/p tilt, modified to stay in neutral Transverse Abd core bracing Wall for support: Pilates ring overhead and chest press  Lumbar extension x 5  Red band horizontal abduction  Hip extension x 15 Hip abduction x 15  Quadruped Rocking x 10, 5 sec hold   Self care: Core bracing and rationale for strengthening, centralization of pain .    [facility] Adult PT Treatment:                                                DATE: 12/10/23 Therapeutic Exercise: Developed, instructed in, and  pt completed therex as noted in HEP                                                                                                                          PATIENT EDUCATION:  Education details: Eval findings, POC, HEP, self care Person educated: Patient Education method: Explanation, Demonstration, Tactile cues, Verbal cues, and Handouts Education comprehension: verbalized understanding, returned demonstration, verbal cues required, and tactile cues required  HOME EXERCISE PROGRAM: Access Code: [email or web address] URL:  [email or web address] Date: 12/15/2023 Prepared by: Karie Mainland  Exercises - Standing Lumbar Extension  - 3-6 x daily - 7 x weekly - 1 sets - 10 reps - 2 hold - Supine Piriformis Stretch with Foot on Ground  - 3 x daily - 7 x weekly - 1 sets - 3-5 reps - 30 hold - Seated Sciatic Tensioner  - 2 x daily - 7 x weekly - 1 sets - 10 reps - 1 hold - Hooklying Transversus Abdominis Palpation  - 1-3 x daily - 7 x weekly - 2 sets - 10 reps - 5-10 hold - Standing Lumbar Extension at Wall  - 1-3 x daily - 7 x weekly - 2 sets - 10 reps - 10 hold - Quadruped Rocking Slow  - 1-3 x daily - 7 x weekly - 2 sets - 10 reps - 5 hold   ASSESSMENT:  CLINICAL IMPRESSION: Patient was able to tolerate standing core and postural exercises well.  She was given extension and neutral based exercises today to further centralize her symptoms.  Treatment targeted towards core and postural awareness with stability exercises. She will continue to benefit from skilled PT to improve her outcomes and hopefully avoid surgery.    EVAL: Patient is a 38 y.o. female who was seen today for physical therapy evaluation and treatment for M51.26 (ICD-10-CM) - Herniated nucleus pulposus, lumbar.  Pt presents to PT with improving R low back and R LE pain, but with signs and symptoms consistent with referring Dx. R SLR and slump tests are positive and forward trunk flexion is significantly limited with marked provocation of concordant pain. Myotome screen was negative. Pt was started on a HEP. Pt will benefit from skilled PT to address impairments to optimize back function with less pain.   OBJECTIVE IMPAIRMENTS: decreased activity tolerance, decreased ROM, decreased strength, postural dysfunction, and obesity.   ACTIVITY LIMITATIONS: carrying, lifting, bending, sitting, squatting, and bed mobility  PARTICIPATION LIMITATIONS: meal prep, cleaning, laundry, driving, shopping, community activity, and occupation  PERSONAL  FACTORS: Fitness, Time since onset of injury/illness/exacerbation, and 1 comorbidity: high BMI  are also affecting patient's functional outcome.   REHAB POTENTIAL: Good  CLINICAL DECISION MAKING: Evolving/moderate complexity  EVALUATION COMPLEXITY: Moderate   GOALS:  SHORT TERM GOALS=LTGs   LONG TERM GOALS: Target date: 01/30/24  Pt will be Ind in a final HEP to maintain achieved LOF  Baseline: started Goal status: ongoing   2.  Pt will voice understanding of measures to  assist in pain reduction  Baseline: started Goal status: INITIAL  3.  Increase forward bending to to min limited for improved function and indication of decreased pain Baseline: markedly limited to top of kness Goal status: INITIAL  4.  Pt will report 50% or greater decrease in R low back and LE pain for improved function and QOL Baseline: 4/10 Goal status: INITIAL  5.  Pt's Mod Oswestry score will improved to the predicted value of 22% as indication of improved function  Baseline: 44% Goal status: INITIAL  6.  Pt will demonstrate proper body mechanics to assist with pain management and return to work Baseline: not started Goal status: INITIAL  PLAN:  PT FREQUENCY: 2x/week  PT DURATION: 6 weeks  PLANNED INTERVENTIONS: 97164- PT Re-evaluation, 97110-Therapeutic exercises, 97530- Therapeutic activity, 97535- Self Care, 78295- Manual therapy, 97014- Electrical stimulation (unattended), [id number]- Electrical stimulation (manual), [id number]- Traction (mechanical), Patient/Family education, Taping, Dry Needling, Joint mobilization, Spinal mobilization, Cryotherapy, and Moist heat.  PLAN FOR NEXT SESSION: Extension and neutral spine stability ,  assess response to HEP; progress therex as indicated; use of modalities, manual therapy; and TPDN as indicated. Karie Mainland, PT 12/15/23 8:42 AM Phone: [phone] Fax: [phone]

## 2023-12-17 ENCOUNTER — Ambulatory Visit: Payer: Managed Care, Other (non HMO) | Admitting: Physical Therapy

## 2023-12-19 ENCOUNTER — Encounter: Payer: Self-pay | Admitting: Physical Therapy

## 2023-12-19 ENCOUNTER — Ambulatory Visit: Payer: Managed Care, Other (non HMO) | Admitting: Physical Therapy

## 2023-12-19 DIAGNOSIS — G8929 Other chronic pain: Secondary | ICD-10-CM

## 2023-12-19 DIAGNOSIS — M5441 Lumbago with sciatica, right side: Secondary | ICD-10-CM | POA: Diagnosis not present

## 2023-12-19 DIAGNOSIS — M6281 Muscle weakness (generalized): Secondary | ICD-10-CM

## 2023-12-19 NOTE — Therapy (Signed)
 OUTPATIENT PHYSICAL THERAPY THORACOLUMBAR NOTE    Patient Name: Valerie Knapp MRN: [id number] DOB:[birth year], 38 y.o., female Today's Date: 12/19/2023  END OF SESSION:  PT End of Session - 12/19/23 0850     Visit Number 3    Number of Visits 13    Date for PT Re-Evaluation 01/30/24    Authorization Type [employer] MANAGED    PT Start Time 0848    PT Stop Time 0935    PT Time Calculation (min) 47 min    Activity Tolerance Patient tolerated treatment well    Behavior During Therapy WFL for tasks assessed/performed               Past Medical History:  Diagnosis Date   Migraine    Past Surgical History:  Procedure Laterality Date   NO PAST SURGERIES     SCLERAL BUCKLE WITH CRYO Right 10/20/2015   Procedure: SCLERAL BUCKLE WITH CRYO, EXTERNAL DRAIN;  Surgeon: Onesimo Blanch, MD;  Location: [facility] OR;  Service: Ophthalmology;  Laterality: Right;   Patient Active Problem List   Diagnosis Date Noted   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   Leukocytosis 11/17/2023   Acute low back pain 11/14/2023   Hypokalemia 11/14/2023   Chronic migraine 10/19/2015    PCP: Patient, No Pcp Per   REFERRING PROVIDER: Lanis Pupa, MD   REFERRING DIAG: M51.26 (ICD-10-CM) - Herniated nucleus pulposus, lumbar   Rationale for Evaluation and Treatment: Rehabilitation  THERAPY DIAG:  Chronic right-sided low back pain with right-sided sciatica  Muscle weakness (generalized)  ONSET DATE: 10/21/23  SUBJECTIVE:                                                                                                                                                                                           SUBJECTIVE STATEMENT: I had to take meds Tues my back felt like it was going to lock up like last time.  Pt had to take more meds as a result.  It is back to a manageable 2/10, pain is in the buttocks but not down the leg. I can actually lie on my Rt side.   PERTINENT HISTORY:  High BMI  PAIN:   Are you having pain? Yes: NPRS scale: 2/10. Pain range the week prio to starting PT: 2-4/10 Pain location: R low back and LE Pain description: ache, throb Aggravating factors: Transitions sit to/from stand, lying on stomach, bending forward, sitting c good posture Relieving factors: Ibuprofen, lying on R side or back, hot and cold packs   PRECAUTIONS: None  RED FLAGS: None   WEIGHT BEARING RESTRICTIONS: No  FALLS:  Has patient fallen in  last 6 months? 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 Hopes to return to work by March   OBJECTIVE:  Note: Objective measures were completed at Evaluation unless otherwise noted.  DIAGNOSTIC FINDINGS:  MR lumbar spine 11/13/23 IMPRESSION: At L5-S1, large right subarticular disc protrusion with impingement of the descending right S1 nerve roots and severe canal stenosis.  PATIENT SURVEYS:  Modified Oswestry 42%   COGNITION: Overall cognitive status: Within functional limits for tasks assessed     SENSATION: WFL  MUSCLE LENGTH: Hamstrings: Right limited deg; Left NT deg Debby test: Right NT deg; Left NT deg  POSTURE: rounded shoulders, forward head, decreased lumbar lordosis, and decreased thoracic kyphosis  PALPATION: Not TTP of the low back, gluteal, or hip regions  LUMBAR ROM:   AROM eval  Flexion Markedly limited- mid thigh; pulling p  Extension Min limited; p  Right lateral flexion Full; no p  Left lateral flexion Full; pulling pain  Right rotation Full; min p  Left rotation Full; min p  P= cocordant pain  (Blank rows = not tested)  LOWER EXTREMITY ROM:     WNLs for functional movements Active  Right eval Left eval  Hip flexion    Hip extension    Hip abduction    Hip  adduction    Hip internal rotation    Hip external rotation    Knee flexion    Knee extension    Ankle dorsiflexion    Ankle plantarflexion    Ankle inversion    Ankle eversion     (Blank rows = not tested)  LOWER EXTREMITY MMT:    Myotome screen negative. Weak core. MMT Right eval Left eval  Hip flexion 5 5  Hip extension    Hip abduction    Hip adduction    Hip internal rotation    Hip external rotation    Knee flexion 5 5  Knee extension 5 5  Ankle dorsiflexion 5 5  Ankle plantarflexion 5 5  Ankle inversion    Ankle eversion     (Blank rows = not tested)  LUMBAR SPECIAL TESTS:  Straight leg raise test: Positive and Slump test: Positive  FUNCTIONAL TESTS:  NT  GAIT: Distance walked: 200' Assistive device utilized: None Level of assistance: Complete Independence Comments: WNLs  TREATMENT DATE:   [facility] Adult PT Treatment:                                                DATE: 12/19/23 Therapeutic Activity: Supine hip mobility (leg crossed) push and pull  Hip ER/IR knees wide in supine  Supine bridge x 10 with ball  Nerve flossing Rt LE supine thigh supported  Ball under pelvis increased pain  Double leg blue band x 15  Single leg bent knee fall out x 10  Sidelying blue band x 15  Quadruped progression UE , LE x 5 each then to bird dog  Wall extension double arm x 5  Wall sit with Pilates ring overhead Wall sit with KB press out x 5 for core  Wall squat with 5 lbs x 10 , partial ROM  Hinging practice wall nearby ,  had pain when reaching arms out for L stretch    [facility] Adult PT Treatment:                                                DATE: 12/15/23 Supine LTR small ROM  Supine piriformis  Sciatic nerve flossing thigh supported by towel  Leg lengthener x 5  Core Stability: pain with A/p tilt, modified to stay in neutral Transverse Abd core bracing Wall for support: Pilates ring overhead and chest press  Lumbar extension x 5  Red band horizontal abduction   Hip extension x 15 Hip abduction x 15  Quadruped Rocking x 10, 5 sec hold   Self care: Core bracing and rationale for strengthening, centralization of pain .    [facility] Adult PT Treatment:                                                DATE: 12/10/23 Therapeutic Exercise: Developed, instructed in, and pt completed therex as noted in HEP                                                                                                                          PATIENT EDUCATION:  Education details: Eval findings, POC, HEP, self care Person educated: Patient Education method: Explanation, Demonstration, Tactile cues, Verbal cues, and Handouts Education comprehension: verbalized understanding, returned demonstration, verbal cues required, and tactile cues required  HOME EXERCISE PROGRAM: Access Code: [email or web address] URL: [email or web address] Date: 12/15/2023 Prepared by: Delon Norma  Exercises - Standing Lumbar Extension  - 3-6 x daily - 7 x weekly - 1 sets - 10 reps - 2 hold - Supine Piriformis Stretch with Foot on Ground  - 3 x daily - 7 x weekly - 1 sets - 3-5 reps - 30 hold - Seated Sciatic Tensioner  - 2 x daily - 7 x weekly - 1 sets - 10 reps - 1 hold - Hooklying Transversus Abdominis Palpation  - 1-3 x daily - 7 x weekly - 2 sets - 10 reps - 5-10 hold - Standing Lumbar Extension at Wall  - 1-3 x daily - 7 x weekly - 2 sets - 10 reps - 10 hold - Quadruped Rocking Slow  - 1-3 x daily - 7 x weekly - 2 sets - 10 reps - 5 hold   ASSESSMENT:  CLINICAL IMPRESSION: Pt continues to make progress, reporting less LE symptoms and sensory symptoms. Her pain can be unpredictable at this time we will cont to stay gentle with loading and spine stability. She needs cues to avoid spinal flexion with core activation in supine and with hinge pattern. L wrist limited weightbearing for core work today, modified.  EVAL: Patient is a 38 y.o. female who was seen today for physical therapy  evaluation and treatment for M51.26 (ICD-10-CM) - Herniated nucleus pulposus, lumbar.  Pt presents to PT with improving R low back and R LE pain, but with signs and symptoms consistent with referring Dx. R SLR and slump tests are positive and forward trunk flexion is significantly limited with marked provocation of concordant pain. Myotome screen was negative. Pt was started on a HEP. Pt will benefit from skilled PT to address impairments to optimize back function with less pain.   OBJECTIVE IMPAIRMENTS: decreased activity tolerance, decreased ROM, decreased strength, postural dysfunction, and obesity.   ACTIVITY LIMITATIONS: carrying, lifting, bending, sitting, squatting, and bed mobility  PARTICIPATION LIMITATIONS: meal prep, cleaning, laundry, driving, shopping, community activity, and occupation  PERSONAL FACTORS: Fitness, Time since onset of injury/illness/exacerbation, and 1 comorbidity: high BMI  are also affecting patient's functional outcome.   REHAB POTENTIAL: Good  CLINICAL DECISION MAKING: Evolving/moderate complexity  EVALUATION COMPLEXITY: Moderate   GOALS:  SHORT TERM GOALS=LTGs   LONG TERM GOALS: Target date: 01/30/24  Pt will be Ind in a final HEP to maintain achieved LOF  Baseline: started Goal status: ongoing   2.  Pt will voice understanding of measures to assist in pain reduction  Baseline: started Goal status: INITIAL  3.  Increase forward bending to to min limited for improved function and indication of decreased pain Baseline: markedly limited to top of kness Goal status: INITIAL  4.  Pt will report 50% or greater decrease in R low back and LE pain for improved function and QOL Baseline: 4/10 Goal status: INITIAL  5.  Pt's Mod Oswestry score will improved to the predicted value of 22% as indication of improved function  Baseline: 44% Goal status: INITIAL  6.  Pt will demonstrate proper body mechanics to assist with pain management and return to  work Baseline: not started Goal status: INITIAL  PLAN:  PT FREQUENCY: 2x/week  PT DURATION: 6 weeks  PLANNED INTERVENTIONS: 97164- PT Re-evaluation, 97110-Therapeutic exercises, 97530- Therapeutic activity, 97535- Self Care, 02859- Manual therapy, 97014- Electrical stimulation (unattended), [id number]- Electrical stimulation (manual), [id number]- Traction (mechanical), Patient/Family education, Taping, Dry Needling, Joint mobilization, Spinal mobilization, Cryotherapy, and Moist heat.  PLAN FOR NEXT SESSION: Extension and neutral spine stability , assess response to HEP; progress therex as indicated; use of modalities, manual therapy; and TPDN as indicated.  Delon Norma, PT 12/19/23 9:58 AM Phone: [phone] Fax: [phone]

## 2023-12-23 ENCOUNTER — Ambulatory Visit: Payer: Managed Care, Other (non HMO) | Admitting: Physical Therapy

## 2023-12-23 DIAGNOSIS — M6281 Muscle weakness (generalized): Secondary | ICD-10-CM

## 2023-12-23 DIAGNOSIS — M5441 Lumbago with sciatica, right side: Secondary | ICD-10-CM | POA: Diagnosis not present

## 2023-12-23 DIAGNOSIS — G8929 Other chronic pain: Secondary | ICD-10-CM

## 2023-12-23 NOTE — Therapy (Addendum)
OUTPATIENT PHYSICAL THERAPY THORACOLUMBAR NOTE    Patient Name: Valerie Knapp MRN: [id number] DOB:01-16-1986, 38 y.o., female Today's Date: 12/23/2023  END OF SESSION:  PT End of Session - 12/23/23 1628     Visit Number 4    Number of Visits 13    Date for PT Re-Evaluation 01/30/24    PT Start Time 1445    PT Stop Time 1530    PT Time Calculation (min) 45 min    Activity Tolerance Patient tolerated treatment well    Behavior During Therapy [facility] for tasks assessed/performed                Past Medical History:  Diagnosis Date   Migraine    Past Surgical History:  Procedure Laterality Date   NO PAST SURGERIES     SCLERAL BUCKLE WITH CRYO Right 10/20/2015   Procedure: SCLERAL BUCKLE WITH CRYO, EXTERNAL DRAIN;  Surgeon: Carmela Rima, MD;  Location: [facility] OR;  Service: Ophthalmology;  Laterality: Right;   Patient Active Problem List   Diagnosis Date Noted   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   Leukocytosis 11/17/2023   Acute low back pain 11/14/2023   Hypokalemia 11/14/2023   Chronic migraine 10/19/2015    PCP: Patient, No Pcp Per   REFERRING PROVIDER: Lisbeth Renshaw, MD   REFERRING DIAG: M51.26 (ICD-10-CM) - Herniated nucleus pulposus, lumbar   Rationale for Evaluation and Treatment: Rehabilitation  THERAPY DIAG:  Chronic right-sided low back pain with right-sided sciatica  Muscle weakness (generalized)  ONSET DATE: 10/21/23  SUBJECTIVE:                                                                                                                                                                                           SUBJECTIVE STATEMENT: Pt stated that she pulled a muscle during bird dogs over the weekend. And hasn't been able to move her knee much since the injury, back feels good and only occasionally hurts when bending over. Primary issue is nerve tension in L leg.   PERTINENT HISTORY:  High BMI  PAIN:  Are you having pain? Yes: NPRS  scale: 2/10. Pain range the week prio to starting PT: 2-4/10 Pain location: R low back and LE Pain description: ache, throb Aggravating factors: Transitions sit to/from stand, lying on stomach, bending forward, sitting c good posture Relieving factors: Ibuprofen, lying on R side or back, hot and cold packs   PRECAUTIONS: None  RED FLAGS: None   WEIGHT BEARING RESTRICTIONS: No  FALLS:  Has patient fallen in last 6 months? 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 Hopes to return to work by March   OBJECTIVE:  Note: Objective measures were completed at Evaluation unless otherwise noted.  DIAGNOSTIC FINDINGS:  MR lumbar spine 11/13/23 IMPRESSION: At L5-S1, large right subarticular disc protrusion with impingement of the descending right S1 nerve roots and severe canal stenosis.  PATIENT SURVEYS:  Modified Oswestry 42%   COGNITION: Overall cognitive status: Within functional limits for tasks assessed     SENSATION: WFL  MUSCLE LENGTH: Hamstrings: Right limited deg; Left NT deg [name] test: Right NT deg; Left NT deg  POSTURE: rounded shoulders, forward head, decreased lumbar lordosis, and decreased thoracic kyphosis  PALPATION: Not TTP of the low back, gluteal, or hip regions  LUMBAR ROM:   AROM eval  Flexion Markedly limited- mid thigh; pulling p  Extension Min limited; p  Right lateral flexion Full; no p  Left lateral flexion Full; pulling pain  Right rotation Full; min p  Left rotation Full; min p  P= cocordant pain  (Blank rows = not tested)  LOWER EXTREMITY ROM:     WNLs for functional movements Active  Right eval Left eval  Hip flexion    Hip extension    Hip abduction    Hip adduction    Hip internal rotation     Hip external rotation    Knee flexion    Knee extension    Ankle dorsiflexion    Ankle plantarflexion    Ankle inversion    Ankle eversion     (Blank rows = not tested)  LOWER EXTREMITY MMT:    Myotome screen negative. Weak core. MMT Right eval Left eval  Hip flexion 5 5  Hip extension    Hip abduction    Hip adduction    Hip internal rotation    Hip external rotation    Knee flexion 5 5  Knee extension 5 5  Ankle dorsiflexion 5 5  Ankle plantarflexion 5 5  Ankle inversion    Ankle eversion     (Blank rows = not tested)  LUMBAR SPECIAL TESTS:  Straight leg raise test: Positive and Slump test: Positive  FUNCTIONAL TESTS:  NT  GAIT: Distance walked: 200' Assistive device utilized: None Level of assistance: Complete Independence Comments: WNLs  TREATMENT DATE:   [facility] Adult PT Treatment:                                                DATE: 12/23/2023   Neuromuscular re-ed: PNF contract relax for L Hamstring  Sciatic tension using rolling stool 2x8, 10s hold Roll from full knee flexion into tolerated extension, DF ankle at end range knee extension Therapeutic Activity: Prone hip extension with core bracing B short lever side plank 3 sets Start at 10s, increase by 10s each set up to 30s.  Self Care: Education regarding pulled muscle, at home management   [facility] Adult PT Treatment:                                                DATE: 12/19/23 Therapeutic Activity: Supine hip mobility (leg  crossed) push and pull  Hip ER/IR knees wide in supine  Supine bridge x 10 with ball  Nerve flossing Rt LE supine thigh supported  Ball under pelvis increased pain  Double leg blue band x 15  Single leg bent knee fall out x 10  Sidelying blue band x 15  Quadruped progression UE , LE x 5 each then to bird dog  Wall extension double arm x 5  Wall sit with Pilates ring overhead Wall sit with KB press out x 5 for core  Wall squat with 5 lbs x 10 , partial ROM  Hinging  practice wall nearby , had pain when reaching arms out for L stretch    [facility] Adult PT Treatment:                                                DATE: 12/15/23 Supine LTR small ROM  Supine piriformis  Sciatic nerve flossing thigh supported by towel  Leg lengthener x 5  Core Stability: pain with A/p tilt, modified to stay in neutral Transverse Abd core bracing Wall for support: Pilates ring overhead and chest press  Lumbar extension x 5  Red band horizontal abduction  Hip extension x 15 Hip abduction x 15  Quadruped Rocking x 10, 5 sec hold   Self care: Core bracing and rationale for strengthening, centralization of pain .    [facility] Adult PT Treatment:                                                DATE: 12/10/23 Therapeutic Exercise: Developed, instructed in, and pt completed therex as noted in HEP                                                                                                                          PATIENT EDUCATION:  Education details: Eval findings, POC, HEP, self care Person educated: Patient Education method: Explanation, Demonstration, Tactile cues, Verbal cues, and Handouts Education comprehension: verbalized understanding, returned demonstration, verbal cues required, and tactile cues required  HOME EXERCISE PROGRAM: Access Code: [email or web address] URL: [email or web address] Date: 12/15/2023 Prepared by: Karie Mainland  Exercises - Standing Lumbar Extension  - 3-6 x daily - 7 x weekly - 1 sets - 10 reps - 2 hold - Supine Piriformis Stretch with Foot on Ground  - 3 x daily - 7 x weekly - 1 sets - 3-5 reps - 30 hold - Seated Sciatic Tensioner  - 2 x daily - 7 x weekly - 1 sets - 10 reps - 1 hold - Hooklying Transversus Abdominis Palpation  - 1-3 x daily - 7 x weekly - 2 sets - 10 reps - 5-10 hold - Standing Lumbar  Extension at Wall  - 1-3 x daily - 7 x weekly - 2 sets - 10 reps - 10 hold - Quadruped Rocking Slow  - 1-3 x daily - 7 x weekly - 2 sets - 10  reps - 5 hold   ASSESSMENT:  CLINICAL IMPRESSION: Pt attended physical therapy session for continuation of treatment regarding LBP with L radicular symptoms. Upon entrance to therapy, pt presented with a highly guarded and highly painful L hamstring. Pt reports pulling it over the weekend while doing bird dogs. Today's treatment focused on improvement of core activation patterns, L sciatic motility,  L hamstring motility and reducing resting tone to improve tolerance to ambulation and general activity. Pt showed  great tolerance to treatment and demonstrated improvement with L hamstring motility, ambulation quality, and L sciatic motility. Pt required moderate cuing as well as minimal assistance for safe and appropriate performance of today's activities. Continue with therapeutic focus on reducing nerve tension, core activation patterns during functional motion, and activity tolerance.     EVAL: Patient is a 38 y.o. female who was seen today for physical therapy evaluation and treatment for M51.26 (ICD-10-CM) - Herniated nucleus pulposus, lumbar.  Pt presents to PT with improving R low back and R LE pain, but with signs and symptoms consistent with referring Dx. R SLR and slump tests are positive and forward trunk flexion is significantly limited with marked provocation of concordant pain. Myotome screen was negative. Pt was started on a HEP. Pt will benefit from skilled PT to address impairments to optimize back function with less pain.   OBJECTIVE IMPAIRMENTS: decreased activity tolerance, decreased ROM, decreased strength, postural dysfunction, and obesity.   ACTIVITY LIMITATIONS: carrying, lifting, bending, sitting, squatting, and bed mobility  PARTICIPATION LIMITATIONS: meal prep, cleaning, laundry, driving, shopping, community activity, and occupation  PERSONAL FACTORS: Fitness, Time since onset of injury/illness/exacerbation, and 1 comorbidity: high BMI  are also affecting patient's  functional outcome.   REHAB POTENTIAL: Good  CLINICAL DECISION MAKING: Evolving/moderate complexity  EVALUATION COMPLEXITY: Moderate   GOALS:  SHORT TERM GOALS=LTGs   LONG TERM GOALS: Target date: 01/30/24  Pt will be Ind in a final HEP to maintain achieved LOF  Baseline: started Goal status: ongoing   2.  Pt will voice understanding of measures to assist in pain reduction  Baseline: started Goal status: INITIAL  3.  Increase forward bending to to min limited for improved function and indication of decreased pain Baseline: markedly limited to top of kness Goal status: INITIAL  4.  Pt will report 50% or greater decrease in R low back and LE pain for improved function and QOL Baseline: 4/10 Goal status: INITIAL  5.  Pt's Mod Oswestry score will improved to the predicted value of 22% as indication of improved function  Baseline: 44% Goal status: INITIAL  6.  Pt will demonstrate proper body mechanics to assist with pain management and return to work Baseline: not started Goal status: INITIAL  PLAN:  PT FREQUENCY: 2x/week  PT DURATION: 6 weeks  PLANNED INTERVENTIONS: 97164- PT Re-evaluation, 97110-Therapeutic exercises, 97530- Therapeutic activity, 97535- Self Care, 16109- Manual therapy, 97014- Electrical stimulation (unattended), [id number]- Electrical stimulation (manual), [id number]- Traction (mechanical), Patient/Family education, Taping, Dry Needling, Joint mobilization, Spinal mobilization, Cryotherapy, and Moist heat.  PLAN FOR NEXT SESSION: Extension and neutral spine stability , assess response to HEP; progress therex as indicated; use of modalities, manual therapy; and TPDN as indicated.  [name], PT, DPT 12/23/2023, 4:29 PM

## 2023-12-25 ENCOUNTER — Ambulatory Visit: Payer: Managed Care, Other (non HMO)

## 2023-12-29 ENCOUNTER — Encounter: Payer: Self-pay | Admitting: Physical Therapy

## 2023-12-29 ENCOUNTER — Ambulatory Visit: Payer: Managed Care, Other (non HMO) | Admitting: Physical Therapy

## 2023-12-29 DIAGNOSIS — M6281 Muscle weakness (generalized): Secondary | ICD-10-CM

## 2023-12-29 DIAGNOSIS — G8929 Other chronic pain: Secondary | ICD-10-CM

## 2023-12-29 DIAGNOSIS — M5441 Lumbago with sciatica, right side: Secondary | ICD-10-CM | POA: Diagnosis not present

## 2023-12-29 NOTE — Therapy (Addendum)
 OUTPATIENT PHYSICAL THERAPY THORACOLUMBAR NOTE   PHYSICAL THERAPY DISCHARGE SUMMARY  Visits from Start of Care: 5  Current functional level related to goals / functional outcomes: see most recent assessment   Remaining deficits: see most recent assessment   Education / Equipment: see most recent assessment   Patient agrees to discharge. Patient goals were  Unknown, pt has not returned . Patient is being discharged due to not returning since the last visit. [name], PT, DPT 02/12/2024, 9:09 AM   Patient Name: Valerie Knapp MRN: [id number] DOB:Oct 31, 1986, 38 y.o., female Today's Date: 12/29/2023  END OF SESSION:  PT End of Session - 12/29/23 1433     Visit Number 5    Number of Visits 13    Date for PT Re-Evaluation 01/30/24    Authorization Type [employer] MANAGED    PT Start Time 1438    PT Stop Time 1523    PT Time Calculation (min) 45 min    Activity Tolerance Patient tolerated treatment well    Behavior During Therapy WFL for tasks assessed/performed                 Past Medical History:  Diagnosis Date   Migraine    Past Surgical History:  Procedure Laterality Date   NO PAST SURGERIES     SCLERAL BUCKLE WITH CRYO Right 10/20/2015   Procedure: SCLERAL BUCKLE WITH CRYO, EXTERNAL DRAIN;  Surgeon: Carmela Rima, MD;  Location: [facility] OR;  Service: Ophthalmology;  Laterality: Right;   Patient Active Problem List   Diagnosis Date Noted   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   Leukocytosis 11/17/2023   Acute low back pain 11/14/2023   Hypokalemia 11/14/2023   Chronic migraine 10/19/2015    PCP: Patient, No Pcp Per   REFERRING PROVIDER: Lisbeth Renshaw, MD   REFERRING DIAG: M51.26 (ICD-10-CM) - Herniated nucleus pulposus, lumbar   Rationale for Evaluation and Treatment: Rehabilitation  THERAPY DIAG:  Chronic right-sided low back pain with right-sided sciatica  Muscle weakness (generalized)  ONSET DATE: 10/21/23  SUBJECTIVE:                                                                                                                                                                                            SUBJECTIVE STATEMENT: Pt stated that she was doing good after last session, and then about 5 minutes after the pain got bad again. Stated she was walking around [employer] and had to stop and sit every 5 minutes to massage out the leg before going again. Back is doing great per pt report.  PERTINENT HISTORY:  High BMI  PAIN:  Are  you having pain? Yes: NPRS scale: 2/10. Pain range the week prio to starting PT: 2-4/10 Pain location: R low back and LE Pain description: ache, throb Aggravating factors: Transitions sit to/from stand, lying on stomach, bending forward, sitting c good posture Relieving factors: Ibuprofen, lying on R side or back, hot and cold packs   PRECAUTIONS: None  RED FLAGS: None   WEIGHT BEARING RESTRICTIONS: No  FALLS:  Has patient fallen in last 6 months? 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 Hopes to return to work by March   OBJECTIVE:  Note: Objective measures were completed at Evaluation unless otherwise noted.  DIAGNOSTIC FINDINGS:  MR lumbar spine 11/13/23 IMPRESSION: At L5-S1, large right subarticular disc protrusion with impingement of the descending right S1 nerve roots and severe canal stenosis.  PATIENT SURVEYS:  Modified Oswestry 42%   COGNITION: Overall cognitive status: Within functional limits for tasks assessed     SENSATION: WFL  MUSCLE LENGTH: Hamstrings: Right limited deg; Left NT deg [name] test: Right NT deg; Left NT deg  POSTURE: rounded shoulders, forward head, decreased lumbar lordosis, and decreased thoracic  kyphosis  PALPATION: Not TTP of the low back, gluteal, or hip regions  LUMBAR ROM:   AROM eval  Flexion Markedly limited- mid thigh; pulling p  Extension Min limited; p  Right lateral flexion Full; no p  Left lateral flexion Full; pulling pain  Right rotation Full; min p  Left rotation Full; min p  P= cocordant pain  (Blank rows = not tested)  LOWER EXTREMITY ROM:     WNLs for functional movements Active  Right eval Left eval  Hip flexion    Hip extension    Hip abduction    Hip adduction    Hip internal rotation    Hip external rotation    Knee flexion    Knee extension    Ankle dorsiflexion    Ankle plantarflexion    Ankle inversion    Ankle eversion     (Blank rows = not tested)  LOWER EXTREMITY MMT:    Myotome screen negative. Weak core. MMT Right eval Left eval  Hip flexion 5 5  Hip extension    Hip abduction    Hip adduction    Hip internal rotation    Hip external rotation    Knee flexion 5 5  Knee extension 5 5  Ankle dorsiflexion 5 5  Ankle plantarflexion 5 5  Ankle inversion    Ankle eversion     (Blank rows = not tested)  LUMBAR SPECIAL TESTS:  Straight leg raise test: Positive and Slump test: Positive  FUNCTIONAL TESTS:  NT  GAIT: Distance walked: 200' Assistive device utilized: None Level of assistance: Complete Independence Comments: WNLs  TREATMENT DATE:   [facility] Adult PT Treatment:                                                DATE: 12/29/2023  Therapeutic Activity: Seated infinity sign with medicine ball 3x1' Seated flexion row 3x15, BTB,  Evaluative measures  Self Care: POC discussion LLE prognosis discussion Pt education   [facility] Adult PT Treatment:  DATE: 12/23/2023   Neuromuscular re-ed: PNF contract relax for L Hamstring  Sciatic tension using rolling stool 2x8, 10s hold Roll from full knee flexion into tolerated extension, DF ankle at end range knee  extension Therapeutic Activity: Prone hip extension with core bracing B short lever side plank 3 sets Start at 10s, increase by 10s each set up to 30s.  Self Care: Education regarding pulled muscle, at home management   PATIENT EDUCATION:  Education details: Eval findings, POC, HEP, self care Person educated: Patient Education method: Explanation, Demonstration, Tactile cues, Verbal cues, and Handouts Education comprehension: verbalized understanding, returned demonstration, verbal cues required, and tactile cues required  HOME EXERCISE PROGRAM: Access Code: [email or web address] URL: [email or web address] Date: 12/15/2023 Prepared by: Karie Mainland  Exercises - Standing Lumbar Extension  - 3-6 x daily - 7 x weekly - 1 sets - 10 reps - 2 hold - Supine Piriformis Stretch with Foot on Ground  - 3 x daily - 7 x weekly - 1 sets - 3-5 reps - 30 hold - Seated Sciatic Tensioner  - 2 x daily - 7 x weekly - 1 sets - 10 reps - 1 hold - Hooklying Transversus Abdominis Palpation  - 1-3 x daily - 7 x weekly - 2 sets - 10 reps - 5-10 hold - Standing Lumbar Extension at Wall  - 1-3 x daily - 7 x weekly - 2 sets - 10 reps - 10 hold - Quadruped Rocking Slow  - 1-3 x daily - 7 x weekly - 2 sets - 10 reps - 5 hold   ASSESSMENT:  CLINICAL IMPRESSION: Pt attended physical therapy session for continuation of treatment regarding LBP. Today's treatment focused on improvement of  core activation patterns,  . Pt showed  good tolerance to treatment and demonstrated improvement with dynamic core activation pattern. Treatment was limited due to RLE pinpoint pain in lateral hamstring muscle belly and discussion regarding symptom onset. Likely hypersensitivity/hyperalgesia associated with Gr. 1 Strain.  Pt states that standing/walking for more than 5 minutes causes 8/10 pain but resting even with exercise is only a 1/10. Pt was educated regarding acute injury management and who to reach out to if symptoms worsen  over the next week with conservative management. Pt required moderate verbal/tactile cuing as well as no assistance for safe and appropriate performance of today's activities. Continue with therapeutic focus on dynamic core stability, sciatic nerve motility.   EVAL: Patient is a 38 y.o. female who was seen today for physical therapy evaluation and treatment for M51.26 (ICD-10-CM) - Herniated nucleus pulposus, lumbar.  Pt presents to PT with improving R low back and R LE pain, but with signs and symptoms consistent with referring Dx. R SLR and slump tests are positive and forward trunk flexion is significantly limited with marked provocation of concordant pain. Myotome screen was negative. Pt was started on a HEP. Pt will benefit from skilled PT to address impairments to optimize back function with less pain.   OBJECTIVE IMPAIRMENTS: decreased activity tolerance, decreased ROM, decreased strength, postural dysfunction, and obesity.   ACTIVITY LIMITATIONS: carrying, lifting, bending, sitting, squatting, and bed mobility  PARTICIPATION LIMITATIONS: meal prep, cleaning, laundry, driving, shopping, community activity, and occupation  PERSONAL FACTORS: Fitness, Time since onset of injury/illness/exacerbation, and 1 comorbidity: high BMI  are also affecting patient's functional outcome.   REHAB POTENTIAL: Good  CLINICAL DECISION MAKING: Evolving/moderate complexity  EVALUATION COMPLEXITY: Moderate   GOALS:  SHORT TERM GOALS=LTGs   LONG TERM GOALS: Target date: 01/30/24  Pt will be Ind in a final HEP to maintain achieved LOF  Baseline: started Goal status: ongoing   2.  Pt will voice understanding of measures to assist in pain reduction  Baseline: started Goal status: INITIAL  3.  Increase forward bending to to min limited for improved function and indication of decreased pain Baseline: markedly limited to top of kness Goal status: INITIAL  4.  Pt will report 50% or greater decrease in  R low back and LE pain for improved function and QOL Baseline: 4/10 Goal status: INITIAL  5.  Pt's Mod Oswestry score will improved to the predicted value of 22% as indication of improved function  Baseline: 44% Goal status: INITIAL  6.  Pt will demonstrate proper body mechanics to assist with pain management and return to work Baseline: not started Goal status: INITIAL  PLAN:  PT FREQUENCY: 2x/week  PT DURATION: 6 weeks  PLANNED INTERVENTIONS: 97164- PT Re-evaluation, 97110-Therapeutic exercises, 97530- Therapeutic activity, 97535- Self Care, 52841- Manual therapy, 97014- Electrical stimulation (unattended), [id number]- Electrical stimulation (manual), [id number]- Traction (mechanical), Patient/Family education, Taping, Dry Needling, Joint mobilization, Spinal mobilization, Cryotherapy, and Moist heat.  PLAN FOR NEXT SESSION: Extension and neutral spine stability , assess response to HEP; progress therex as indicated; use of modalities, manual therapy; and TPDN as indicated.  [name], PT, DPT 12/29/2023, 3:39 PM

## 2024-01-02 NOTE — Therapy (Deleted)
 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  ONSET DATE: 10/21/23  SUBJECTIVE:                                                                                                                                                                                           SUBJECTIVE STATEMENT:    Pt stated that she was doing good after last session, and then about 5 minutes after the pain got bad again. Stated she was walking around [employer] and had to stop and sit every 5 minutes to massage out the leg before going again. Back is doing great per pt report.  PERTINENT HISTORY:  High BMI  PAIN:  Are you having pain? Yes: NPRS scale: 2/10. Pain range the week prio to starting PT: 2-4/10 Pain location: R low back and LE Pain description: ache, throb Aggravating factors: Transitions sit to/from stand, lying on stomach, bending forward, sitting c good posture Relieving factors: Ibuprofen, lying on R side or back, hot and cold packs   PRECAUTIONS: None  RED FLAGS: None   WEIGHT  BEARING RESTRICTIONS: No  FALLS:  Has patient fallen in last 6 months? 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 Hopes to return to work by March   OBJECTIVE:  Note: Objective measures were completed at Evaluation unless otherwise noted.  DIAGNOSTIC FINDINGS:  MR lumbar spine 11/13/23 IMPRESSION: At L5-S1, large right subarticular disc protrusion with impingement of the descending right S1 nerve roots and severe canal stenosis.  PATIENT SURVEYS:  Modified Oswestry 42%   COGNITION: Overall cognitive status: Within functional limits for tasks assessed     SENSATION: WFL  MUSCLE LENGTH: Hamstrings: Right limited deg; Left NT deg [name] test: Right NT deg; Left NT deg  POSTURE: rounded shoulders, forward head, decreased lumbar lordosis, and decreased thoracic kyphosis  PALPATION: Not TTP of the low back, gluteal, or hip regions  LUMBAR ROM:   AROM eval  Flexion Markedly limited- mid thigh; pulling p  Extension Min limited; p  Right lateral flexion Full; no p  Left lateral flexion Full; pulling pain  Right rotation Full; min p  Left rotation Full; min p  P= cocordant pain  (Blank rows = not tested)  LOWER EXTREMITY ROM:     WNLs for functional movements Active  Right eval Left eval  Hip flexion    Hip extension    Hip abduction    Hip adduction    Hip internal rotation    Hip external rotation    Knee flexion    Knee extension    Ankle dorsiflexion    Ankle plantarflexion    Ankle inversion    Ankle eversion     (Blank rows = not tested)  LOWER EXTREMITY MMT:    Myotome screen negative. Weak core. MMT Right eval Left eval  Hip flexion 5 5  Hip extension    Hip abduction    Hip adduction    Hip internal  rotation    Hip external rotation    Knee flexion 5 5  Knee extension 5 5  Ankle dorsiflexion 5 5  Ankle plantarflexion 5 5  Ankle inversion    Ankle eversion     (Blank rows = not tested)  LUMBAR SPECIAL TESTS:  Straight leg raise test: Positive and Slump test: Positive  FUNCTIONAL TESTS:  NT  GAIT: Distance walked: 200' Assistive device utilized: None Level of assistance: Complete Independence Comments: WNLs  TREATMENT DATE:    [facility] Adult PT Treatment:                                                DATE: *** Therapeutic Exercise: *** Manual Therapy: *** Neuromuscular re-ed: *** Therapeutic Activity: *** Modalities: *** Self Care: ***  [facility] Adult PT Treatment:                                                DATE: 12/29/2023  Therapeutic Activity: Seated infinity sign with medicine ball 3x1' Seated flexion row 3x15, BTB,  Evaluative measures  Self Care: POC discussion LLE prognosis discussion Pt education   [facility] Adult PT Treatment:                                                DATE: 12/23/2023   Neuromuscular re-ed: PNF contract relax for L Hamstring  Sciatic tension using rolling stool 2x8, 10s hold Roll from full knee flexion into tolerated extension, DF ankle at end range knee extension Therapeutic Activity: Prone hip extension with core bracing B short lever side plank 3 sets Start at 10s, increase by 10s  each set up to 30s.  Self Care: Education regarding pulled muscle, at home management   PATIENT EDUCATION:  Education details: Eval findings, POC, HEP, self care Person educated: Patient Education method: Explanation, Demonstration, Tactile cues, Verbal cues, and Handouts Education comprehension: verbalized understanding, returned demonstration, verbal cues required, and tactile cues required  HOME EXERCISE PROGRAM: Access Code: [email or web address] URL: [email or web address] Date: 12/15/2023 Prepared by: Karie Mainland  Exercises -  Standing Lumbar Extension  - 3-6 x daily - 7 x weekly - 1 sets - 10 reps - 2 hold - Supine Piriformis Stretch with Foot on Ground  - 3 x daily - 7 x weekly - 1 sets - 3-5 reps - 30 hold - Seated Sciatic Tensioner  - 2 x daily - 7 x weekly - 1 sets - 10 reps - 1 hold - Hooklying Transversus Abdominis Palpation  - 1-3 x daily - 7 x weekly - 2 sets - 10 reps - 5-10 hold - Standing Lumbar Extension at Wall  - 1-3 x daily - 7 x weekly - 2 sets - 10 reps - 10 hold - Quadruped Rocking Slow  - 1-3 x daily - 7 x weekly - 2 sets - 10 reps - 5 hold   ASSESSMENT:  CLINICAL IMPRESSION: Pt attended physical therapy session for continuation of treatment regarding LBP. Today's treatment focused on improvement of  core activation patterns,  . Pt showed  good tolerance to treatment and demonstrated improvement with dynamic core activation pattern. Treatment was limited due to RLE pinpoint pain in lateral hamstring muscle belly and discussion regarding symptom onset. Likely hypersensitivity/hyperalgesia associated with Gr. 1 Strain.  Pt states that standing/walking for more than 5 minutes causes 8/10 pain but resting even with exercise is only a 1/10. Pt was educated regarding acute injury management and who to reach out to if symptoms worsen over the next week with conservative management. Pt required moderate verbal/tactile cuing as well as no assistance for safe and appropriate performance of today's activities. Continue with therapeutic focus on dynamic core stability, sciatic nerve motility.   EVAL: Patient is a 38 y.o. female who was seen today for physical therapy evaluation and treatment for M51.26 (ICD-10-CM) - Herniated nucleus pulposus, lumbar.  Pt presents to PT with improving R low back and R LE pain, but with signs and symptoms consistent with referring Dx. R SLR and slump tests are positive and forward trunk flexion is significantly limited with marked provocation of concordant pain. Myotome screen was  negative. Pt was started on a HEP. Pt will benefit from skilled PT to address impairments to optimize back function with less pain.   OBJECTIVE IMPAIRMENTS: decreased activity tolerance, decreased ROM, decreased strength, postural dysfunction, and obesity.   ACTIVITY LIMITATIONS: carrying, lifting, bending, sitting, squatting, and bed mobility  PARTICIPATION LIMITATIONS: meal prep, cleaning, laundry, driving, shopping, community activity, and occupation  PERSONAL FACTORS: Fitness, Time since onset of injury/illness/exacerbation, and 1 comorbidity: high BMI  are also affecting patient's functional outcome.   REHAB POTENTIAL: Good  CLINICAL DECISION MAKING: Evolving/moderate complexity  EVALUATION COMPLEXITY: Moderate   GOALS:  SHORT TERM GOALS=LTGs   LONG TERM GOALS: Target date: 01/30/24  Pt will be Ind in a final HEP to maintain achieved LOF  Baseline: started Goal status: ongoing   2.  Pt will voice understanding of measures to assist in pain reduction  Baseline: started Goal status: INITIAL  3.  Increase forward bending to to min limited for improved function and indication of decreased pain  Baseline: markedly limited to top of kness Goal status: INITIAL  4.  Pt will report 50% or greater decrease in R low back and LE pain for improved function and QOL Baseline: 4/10 Goal status: INITIAL  5.  Pt's Mod Oswestry score will improved to the predicted value of 22% as indication of improved function  Baseline: 44% Goal status: INITIAL  6.  Pt will demonstrate proper body mechanics to assist with pain management and return to work Baseline: not started Goal status: INITIAL  PLAN:  PT FREQUENCY: 2x/week  PT DURATION: 6 weeks  PLANNED INTERVENTIONS: 97164- PT Re-evaluation, 97110-Therapeutic exercises, 97530- Therapeutic activity, 97535- Self Care, 16109- Manual therapy, 97014- Electrical stimulation (unattended), [id number]- Electrical stimulation (manual), [id number]- Traction  (mechanical), Patient/Family education, Taping, Dry Needling, Joint mobilization, Spinal mobilization, Cryotherapy, and Moist heat.  PLAN FOR NEXT SESSION: Extension and neutral spine stability , assess response to HEP; progress therex as indicated; use of modalities, manual therapy; and TPDN as indicated.  [name], PT, DPT 01/02/2024, 11:36 AM

## 2024-01-05 ENCOUNTER — Ambulatory Visit: Payer: Managed Care, Other (non HMO) | Admitting: Physical Therapy

## 2024-01-07 ENCOUNTER — Ambulatory Visit: Payer: Managed Care, Other (non HMO) | Admitting: Physical Therapy
